# Patient Record
Sex: Male | Born: 1955 | Race: Black or African American | Hispanic: No | Smoking: Former smoker
Health system: Southern US, Community
[De-identification: ages and names within clinical notes are randomized; demographics above are authoritative.]

## PROBLEM LIST (undated history)

## (undated) DIAGNOSIS — R001 Bradycardia, unspecified: Secondary | ICD-10-CM

## (undated) DIAGNOSIS — R7989 Other specified abnormal findings of blood chemistry: Secondary | ICD-10-CM

## (undated) DIAGNOSIS — I10 Essential (primary) hypertension: Secondary | ICD-10-CM

## (undated) DIAGNOSIS — M1990 Unspecified osteoarthritis, unspecified site: Secondary | ICD-10-CM

## (undated) HISTORY — PX: HERNIA REPAIR: SHX51

## (undated) HISTORY — DX: Bradycardia, unspecified: R00.1

## (undated) HISTORY — DX: Unspecified osteoarthritis, unspecified site: M19.90

## (undated) HISTORY — DX: Other specified abnormal findings of blood chemistry: R79.89

## (undated) HISTORY — PX: COLON SURGERY: SHX602

## (undated) HISTORY — PX: KNEE SURGERY: SHX244

---

## 2000-11-28 ENCOUNTER — Encounter: Admission: RE | Admit: 2000-11-28 | Discharge: 2000-11-28 | Payer: Self-pay | Admitting: Family Medicine

## 2000-11-28 ENCOUNTER — Encounter: Payer: Self-pay | Admitting: Family Medicine

## 2000-12-26 ENCOUNTER — Ambulatory Visit (HOSPITAL_COMMUNITY): Admission: RE | Admit: 2000-12-26 | Discharge: 2000-12-26 | Payer: Self-pay | Admitting: *Deleted

## 2004-09-08 ENCOUNTER — Encounter: Admission: RE | Admit: 2004-09-08 | Discharge: 2004-09-08 | Payer: Self-pay | Admitting: Family Medicine

## 2006-01-05 ENCOUNTER — Emergency Department (HOSPITAL_COMMUNITY): Admission: EM | Admit: 2006-01-05 | Discharge: 2006-01-05 | Payer: Self-pay | Admitting: Emergency Medicine

## 2006-10-16 ENCOUNTER — Ambulatory Visit (HOSPITAL_COMMUNITY): Admission: RE | Admit: 2006-10-16 | Discharge: 2006-10-16 | Payer: Self-pay | Admitting: Family Medicine

## 2006-11-01 ENCOUNTER — Encounter: Admission: RE | Admit: 2006-11-01 | Discharge: 2006-11-01 | Payer: Self-pay | Admitting: Family Medicine

## 2007-02-25 ENCOUNTER — Encounter: Admission: RE | Admit: 2007-02-25 | Discharge: 2007-02-25 | Payer: Self-pay | Admitting: Family Medicine

## 2007-04-25 ENCOUNTER — Encounter: Admission: RE | Admit: 2007-04-25 | Discharge: 2007-04-25 | Payer: Self-pay | Admitting: Family Medicine

## 2007-05-02 ENCOUNTER — Encounter: Admission: RE | Admit: 2007-05-02 | Discharge: 2007-05-02 | Payer: Self-pay | Admitting: Orthopedic Surgery

## 2007-05-23 ENCOUNTER — Ambulatory Visit (HOSPITAL_COMMUNITY): Admission: RE | Admit: 2007-05-23 | Discharge: 2007-05-23 | Payer: Self-pay | Admitting: Orthopedic Surgery

## 2007-06-18 ENCOUNTER — Encounter: Admission: RE | Admit: 2007-06-18 | Discharge: 2007-07-17 | Payer: Self-pay | Admitting: Orthopedic Surgery

## 2007-09-16 ENCOUNTER — Encounter: Admission: RE | Admit: 2007-09-16 | Discharge: 2007-09-16 | Payer: Self-pay | Admitting: Orthopedic Surgery

## 2007-10-28 ENCOUNTER — Encounter: Admission: RE | Admit: 2007-10-28 | Discharge: 2007-10-28 | Payer: Self-pay | Admitting: Orthopedic Surgery

## 2007-11-07 ENCOUNTER — Encounter: Admission: RE | Admit: 2007-11-07 | Discharge: 2007-12-05 | Payer: Self-pay | Admitting: Orthopedic Surgery

## 2009-05-17 ENCOUNTER — Inpatient Hospital Stay (HOSPITAL_COMMUNITY): Admission: EM | Admit: 2009-05-17 | Discharge: 2009-05-19 | Payer: Self-pay | Admitting: Emergency Medicine

## 2009-05-22 ENCOUNTER — Emergency Department (HOSPITAL_COMMUNITY): Admission: EM | Admit: 2009-05-22 | Discharge: 2009-05-22 | Payer: Self-pay | Admitting: Emergency Medicine

## 2009-05-23 ENCOUNTER — Emergency Department (HOSPITAL_COMMUNITY): Admission: EM | Admit: 2009-05-23 | Discharge: 2009-05-23 | Payer: Self-pay | Admitting: Emergency Medicine

## 2009-06-15 ENCOUNTER — Encounter: Admission: RE | Admit: 2009-06-15 | Discharge: 2009-09-13 | Payer: Self-pay | Admitting: Orthopedic Surgery

## 2009-11-18 ENCOUNTER — Encounter
Admission: RE | Admit: 2009-11-18 | Discharge: 2010-01-12 | Payer: Self-pay | Source: Home / Self Care | Attending: Orthopedic Surgery | Admitting: Orthopedic Surgery

## 2010-04-12 LAB — COMPREHENSIVE METABOLIC PANEL
ALT: 20 U/L (ref 0–53)
AST: 37 U/L (ref 0–37)
CO2: 15 mEq/L — ABNORMAL LOW (ref 19–32)
Chloride: 103 mEq/L (ref 96–112)
GFR calc Af Amer: 49 mL/min — ABNORMAL LOW (ref >60)
GFR calc non Af Amer: 41 mL/min — ABNORMAL LOW (ref >60)
Glucose, Bld: 199 mg/dL — ABNORMAL HIGH (ref 70–99)
Potassium: 4.3 mEq/L (ref 3.5–5.1)
Total Bilirubin: 1 mg/dL (ref 0.3–1.2)

## 2010-04-12 LAB — HEPATIC FUNCTION PANEL
AST: 38 U/L — ABNORMAL HIGH (ref 0–37)
Albumin: 3.5 g/dL (ref 3.5–5.2)
Alkaline Phosphatase: 51 U/L (ref 39–117)
Total Bilirubin: 1.1 mg/dL (ref 0.3–1.2)

## 2010-04-12 LAB — URINE CULTURE

## 2010-04-12 LAB — CBC
HCT: 34.2 % — ABNORMAL LOW (ref 39.0–52.0)
Hemoglobin: 12.1 g/dL — ABNORMAL LOW (ref 13.0–17.0)
Hemoglobin: 13.1 g/dL (ref 13.0–17.0)
MCHC: 35.5 g/dL (ref 30.0–36.0)
MCV: 90.7 fL (ref 78.0–100.0)
MCV: 91.9 fL (ref 78.0–100.0)
Platelets: 211 10*3/uL (ref 150–400)
RDW: 11.8 % (ref 11.5–15.5)
RDW: 12 % (ref 11.5–15.5)
RDW: 12.1 % (ref 11.5–15.5)

## 2010-04-12 LAB — POCT I-STAT, CHEM 8
Creatinine, Ser: 1.7 mg/dL — ABNORMAL HIGH (ref 0.4–1.5)
Glucose, Bld: 198 mg/dL — ABNORMAL HIGH (ref 70–99)
Hemoglobin: 13.6 g/dL (ref 13.0–17.0)
Potassium: 4.1 mEq/L (ref 3.5–5.1)

## 2010-04-12 LAB — BASIC METABOLIC PANEL
BUN: 15 mg/dL (ref 6–23)
CO2: 23 mEq/L (ref 19–32)
Chloride: 107 mEq/L (ref 96–112)
Creatinine, Ser: 1.19 mg/dL (ref 0.4–1.5)

## 2010-04-12 LAB — URINALYSIS, ROUTINE W REFLEX MICROSCOPIC
Bilirubin Urine: NEGATIVE
Protein, ur: 30 mg/dL — AB

## 2010-04-12 LAB — ETHANOL: Alcohol, Ethyl (B): <5 mg/dL (ref 0–10)

## 2010-04-12 LAB — LACTIC ACID, PLASMA: Lactic Acid, Venous: 0.7 mmol/L (ref 0.5–2.2)

## 2010-04-12 LAB — RAPID URINE DRUG SCREEN, HOSP PERFORMED
Barbiturates: NOT DETECTED
Benzodiazepines: NOT DETECTED

## 2010-04-12 LAB — URINE MICROSCOPIC-ADD ON

## 2010-04-12 LAB — LIPASE, BLOOD: Lipase: 27 U/L (ref 11–59)

## 2010-04-12 LAB — PROTIME-INR: Prothrombin Time: 13.4 seconds (ref 11.6–15.2)

## 2010-06-07 NOTE — Op Note (Signed)
NAMEKENDAL, Randy Melton             ACCOUNT NO.:  [id number]   MEDICAL RECORD NO.:  [id number]          PATIENT TYPE:  AMB   LOCATION:  SDS                          FACILITY:  [facility]   PHYSICIAN:  Myrtie Neither, MD      DATE OF BIRTH:  [birth year]   DATE OF PROCEDURE:  05/23/2007  DATE OF DISCHARGE:                               OPERATIVE REPORT   PREOPERATIVE DIAGNOSIS:  Internal derangement of left knee.   POSTOPERATIVE DIAGNOSES:  1. Medial meniscal tear.  2. Condyle defect, intercondylar notch.  3. Patellar plaque with chronic synovitis, medial lateral compartment.  4. Partial lateral meniscal tear.   ANESTHESIA:  General.   PROCEDURE:  1. Arthroscopic partial medial meniscectomy.  2. Synovectomy with excision of plaque, complete.  3. Partial lateral meniscectomy.  4. Chondroplasty, intercondylar notch.   The patient was taken to the operating room and was given adequate preop  medications, given general anesthesia and was intubated.  Left knee was  prepped with DuraPrep and draped in sterile manner.  Tourniquet used for  hemostasis.  A 1/2-inch puncture wound made along the edge of the medial  and lateral joint line.  Inflow portal through the median  suprapatellar  pouch area.  Inspection of the joint revealed posterior horn tear of the  medial meniscus and partial mid section meniscal tear of the lateral  meniscus.  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  Loose bodies.  ACL was intact.  With large  patellar plaque with chronic synovitis in both medial and lateral  compartments.  With a synovial shaver, the patellar plaque and synovium  were resected.  Followed by, use of basket forceps for partial resection  of the medial meniscus as well as the bed of the of the lateral  meniscus.  Chondroplasty was done at the intercondylar notch with a  synovial  shaver.  This was further roughened up around the entire  surface.  Loose fragments were removed.  Further inspection did not  reveal any other loose fragments.  Wound closure was then done with 4-0  nylon.  A 15 mL of 0.25% plain Marcaine was injected into the knee.  Compressive dressing was applied.  The patient tolerated the procedure  quite well and taken to recovery room in stable and satisfactory  condition.  The patient is being discharged home.  Percocet 1-2 q.4  p.r.n. pain, ice packs, partial weightbearing on the left side with the  use of crutches.  The patient is to return to the office in 1 week.  The  patient is being discharged in stable and satisfactory condition.     Myrtie Neither, MD  Electronically Signed    AC/MEDQ  D:  05/23/2007  T:  05/23/2007  Job:  [id number]

## 2010-06-27 ENCOUNTER — Encounter (INDEPENDENT_AMBULATORY_CARE_PROVIDER_SITE_OTHER): Payer: Self-pay | Admitting: General Surgery

## 2010-09-19 ENCOUNTER — Ambulatory Visit (INDEPENDENT_AMBULATORY_CARE_PROVIDER_SITE_OTHER): Payer: Self-pay | Admitting: General Surgery

## 2010-10-18 LAB — URINALYSIS, ROUTINE W REFLEX MICROSCOPIC
Glucose, UA: NEGATIVE
Hgb urine dipstick: NEGATIVE
Protein, ur: NEGATIVE

## 2010-10-18 LAB — CBC
Platelets: 277
RDW: 12

## 2010-10-18 LAB — COMPREHENSIVE METABOLIC PANEL
ALT: 21
Albumin: 3.8
Alkaline Phosphatase: 69
Potassium: 3.9
Sodium: 138
Total Protein: 6.9

## 2010-12-12 ENCOUNTER — Encounter (INDEPENDENT_AMBULATORY_CARE_PROVIDER_SITE_OTHER): Payer: Self-pay | Admitting: General Surgery

## 2012-01-25 DIAGNOSIS — I10 Essential (primary) hypertension: Secondary | ICD-10-CM | POA: Diagnosis not present

## 2012-01-25 DIAGNOSIS — Z125 Encounter for screening for malignant neoplasm of prostate: Secondary | ICD-10-CM | POA: Diagnosis not present

## 2012-01-25 DIAGNOSIS — E782 Mixed hyperlipidemia: Secondary | ICD-10-CM | POA: Diagnosis not present

## 2012-01-29 DIAGNOSIS — R0602 Shortness of breath: Secondary | ICD-10-CM | POA: Diagnosis not present

## 2012-02-08 DIAGNOSIS — R9431 Abnormal electrocardiogram [ECG] [EKG]: Secondary | ICD-10-CM | POA: Diagnosis not present

## 2012-02-15 DIAGNOSIS — I10 Essential (primary) hypertension: Secondary | ICD-10-CM | POA: Diagnosis not present

## 2012-03-13 DIAGNOSIS — Z86010 Personal history of colonic polyps: Secondary | ICD-10-CM | POA: Diagnosis not present

## 2012-03-22 ENCOUNTER — Ambulatory Visit
Admission: RE | Admit: 2012-03-22 | Discharge: 2012-03-22 | Disposition: A | Discharge status: Home / Self Care | Payer: Medicare Other | Source: Ambulatory Visit | Attending: Family Medicine | Admitting: Family Medicine

## 2012-03-22 ENCOUNTER — Other Ambulatory Visit: Payer: Self-pay | Admitting: Family Medicine

## 2012-03-22 DIAGNOSIS — M25559 Pain in unspecified hip: Secondary | ICD-10-CM | POA: Diagnosis not present

## 2012-03-22 DIAGNOSIS — M1250 Traumatic arthropathy, unspecified site: Secondary | ICD-10-CM

## 2012-03-22 DIAGNOSIS — M169 Osteoarthritis of hip, unspecified: Secondary | ICD-10-CM | POA: Diagnosis not present

## 2012-03-22 DIAGNOSIS — R52 Pain, unspecified: Secondary | ICD-10-CM

## 2012-03-22 DIAGNOSIS — I10 Essential (primary) hypertension: Secondary | ICD-10-CM | POA: Diagnosis not present

## 2012-03-26 DIAGNOSIS — M87059 Idiopathic aseptic necrosis of unspecified femur: Secondary | ICD-10-CM | POA: Diagnosis not present

## 2012-04-09 DIAGNOSIS — Z1211 Encounter for screening for malignant neoplasm of colon: Secondary | ICD-10-CM | POA: Diagnosis not present

## 2012-04-09 DIAGNOSIS — K649 Unspecified hemorrhoids: Secondary | ICD-10-CM | POA: Diagnosis not present

## 2012-04-09 DIAGNOSIS — K5730 Diverticulosis of large intestine without perforation or abscess without bleeding: Secondary | ICD-10-CM | POA: Diagnosis not present

## 2012-04-09 DIAGNOSIS — Z86010 Personal history of colonic polyps: Secondary | ICD-10-CM | POA: Diagnosis not present

## 2012-04-15 DIAGNOSIS — M87059 Idiopathic aseptic necrosis of unspecified femur: Secondary | ICD-10-CM | POA: Diagnosis not present

## 2012-06-14 DIAGNOSIS — H2510 Age-related nuclear cataract, unspecified eye: Secondary | ICD-10-CM | POA: Diagnosis not present

## 2012-06-18 DIAGNOSIS — M87059 Idiopathic aseptic necrosis of unspecified femur: Secondary | ICD-10-CM | POA: Diagnosis not present

## 2012-06-20 DIAGNOSIS — M109 Gout, unspecified: Secondary | ICD-10-CM | POA: Diagnosis not present

## 2012-06-20 DIAGNOSIS — I10 Essential (primary) hypertension: Secondary | ICD-10-CM | POA: Diagnosis not present

## 2012-07-09 DIAGNOSIS — M2040 Other hammer toe(s) (acquired), unspecified foot: Secondary | ICD-10-CM | POA: Diagnosis not present

## 2012-07-09 DIAGNOSIS — Z01818 Encounter for other preprocedural examination: Secondary | ICD-10-CM | POA: Diagnosis not present

## 2012-07-09 DIAGNOSIS — M79609 Pain in unspecified limb: Secondary | ICD-10-CM | POA: Diagnosis not present

## 2012-07-09 DIAGNOSIS — B351 Tinea unguium: Secondary | ICD-10-CM | POA: Diagnosis not present

## 2012-07-15 DIAGNOSIS — M25579 Pain in unspecified ankle and joints of unspecified foot: Secondary | ICD-10-CM | POA: Diagnosis not present

## 2012-08-07 DIAGNOSIS — M21619 Bunion of unspecified foot: Secondary | ICD-10-CM | POA: Diagnosis not present

## 2012-08-07 DIAGNOSIS — Z01818 Encounter for other preprocedural examination: Secondary | ICD-10-CM | POA: Diagnosis not present

## 2012-08-20 DIAGNOSIS — M7750 Other enthesopathy of unspecified foot: Secondary | ICD-10-CM | POA: Diagnosis not present

## 2012-08-20 DIAGNOSIS — M25579 Pain in unspecified ankle and joints of unspecified foot: Secondary | ICD-10-CM | POA: Diagnosis not present

## 2012-08-20 DIAGNOSIS — M2040 Other hammer toe(s) (acquired), unspecified foot: Secondary | ICD-10-CM | POA: Diagnosis not present

## 2012-08-20 DIAGNOSIS — I10 Essential (primary) hypertension: Secondary | ICD-10-CM | POA: Diagnosis not present

## 2012-08-23 DIAGNOSIS — M25579 Pain in unspecified ankle and joints of unspecified foot: Secondary | ICD-10-CM | POA: Diagnosis not present

## 2012-09-04 DIAGNOSIS — M25579 Pain in unspecified ankle and joints of unspecified foot: Secondary | ICD-10-CM | POA: Diagnosis not present

## 2012-09-18 DIAGNOSIS — M25579 Pain in unspecified ankle and joints of unspecified foot: Secondary | ICD-10-CM | POA: Diagnosis not present

## 2012-09-18 DIAGNOSIS — B351 Tinea unguium: Secondary | ICD-10-CM | POA: Diagnosis not present

## 2012-10-16 DIAGNOSIS — B351 Tinea unguium: Secondary | ICD-10-CM | POA: Diagnosis not present

## 2012-10-16 DIAGNOSIS — M79609 Pain in unspecified limb: Secondary | ICD-10-CM | POA: Diagnosis not present

## 2012-10-16 DIAGNOSIS — M25579 Pain in unspecified ankle and joints of unspecified foot: Secondary | ICD-10-CM | POA: Diagnosis not present

## 2012-10-22 DIAGNOSIS — E7800 Pure hypercholesterolemia, unspecified: Secondary | ICD-10-CM | POA: Diagnosis not present

## 2012-10-22 DIAGNOSIS — I10 Essential (primary) hypertension: Secondary | ICD-10-CM | POA: Diagnosis not present

## 2013-01-13 DIAGNOSIS — M79609 Pain in unspecified limb: Secondary | ICD-10-CM | POA: Diagnosis not present

## 2013-02-19 DIAGNOSIS — I10 Essential (primary) hypertension: Secondary | ICD-10-CM | POA: Diagnosis not present

## 2013-02-19 DIAGNOSIS — E782 Mixed hyperlipidemia: Secondary | ICD-10-CM | POA: Diagnosis not present

## 2013-02-19 DIAGNOSIS — Z125 Encounter for screening for malignant neoplasm of prostate: Secondary | ICD-10-CM | POA: Diagnosis not present

## 2013-03-10 DIAGNOSIS — M87059 Idiopathic aseptic necrosis of unspecified femur: Secondary | ICD-10-CM | POA: Diagnosis not present

## 2013-06-10 DIAGNOSIS — M7120 Synovial cyst of popliteal space [Baker], unspecified knee: Secondary | ICD-10-CM | POA: Diagnosis not present

## 2013-07-22 DIAGNOSIS — I10 Essential (primary) hypertension: Secondary | ICD-10-CM | POA: Diagnosis not present

## 2013-08-14 DIAGNOSIS — H2510 Age-related nuclear cataract, unspecified eye: Secondary | ICD-10-CM | POA: Diagnosis not present

## 2013-11-04 DIAGNOSIS — M5136 Other intervertebral disc degeneration, lumbar region: Secondary | ICD-10-CM | POA: Diagnosis not present

## 2013-11-05 ENCOUNTER — Ambulatory Visit
Admission: RE | Admit: 2013-11-05 | Discharge: 2013-11-05 | Disposition: A | Discharge status: Home / Self Care | Payer: Medicare Other | Source: Ambulatory Visit | Attending: Orthopedic Surgery | Admitting: Orthopedic Surgery

## 2013-11-05 ENCOUNTER — Other Ambulatory Visit: Payer: Self-pay | Admitting: Orthopedic Surgery

## 2013-11-05 DIAGNOSIS — M4316 Spondylolisthesis, lumbar region: Secondary | ICD-10-CM | POA: Diagnosis not present

## 2013-11-05 DIAGNOSIS — M5136 Other intervertebral disc degeneration, lumbar region: Secondary | ICD-10-CM

## 2013-11-05 DIAGNOSIS — M47817 Spondylosis without myelopathy or radiculopathy, lumbosacral region: Secondary | ICD-10-CM | POA: Diagnosis not present

## 2013-11-05 DIAGNOSIS — M5137 Other intervertebral disc degeneration, lumbosacral region: Secondary | ICD-10-CM | POA: Diagnosis not present

## 2013-11-13 ENCOUNTER — Ambulatory Visit
Admission: RE | Admit: 2013-11-13 | Discharge: 2013-11-13 | Disposition: A | Discharge status: Home / Self Care | Payer: Medicare Other | Source: Ambulatory Visit | Attending: Family Medicine | Admitting: Family Medicine

## 2013-11-13 ENCOUNTER — Other Ambulatory Visit: Payer: Self-pay | Admitting: Family Medicine

## 2013-11-13 DIAGNOSIS — M47816 Spondylosis without myelopathy or radiculopathy, lumbar region: Secondary | ICD-10-CM | POA: Diagnosis not present

## 2013-11-13 DIAGNOSIS — M545 Low back pain, unspecified: Secondary | ICD-10-CM

## 2013-11-13 DIAGNOSIS — M5137 Other intervertebral disc degeneration, lumbosacral region: Secondary | ICD-10-CM | POA: Diagnosis not present

## 2013-11-13 DIAGNOSIS — M4316 Spondylolisthesis, lumbar region: Secondary | ICD-10-CM | POA: Diagnosis not present

## 2013-11-18 DIAGNOSIS — M545 Low back pain: Secondary | ICD-10-CM | POA: Diagnosis not present

## 2013-12-01 DIAGNOSIS — M62838 Other muscle spasm: Secondary | ICD-10-CM | POA: Diagnosis not present

## 2013-12-01 DIAGNOSIS — M9902 Segmental and somatic dysfunction of thoracic region: Secondary | ICD-10-CM | POA: Diagnosis not present

## 2013-12-01 DIAGNOSIS — M9903 Segmental and somatic dysfunction of lumbar region: Secondary | ICD-10-CM | POA: Diagnosis not present

## 2013-12-03 DIAGNOSIS — M4316 Spondylolisthesis, lumbar region: Secondary | ICD-10-CM | POA: Diagnosis not present

## 2013-12-04 DIAGNOSIS — M62838 Other muscle spasm: Secondary | ICD-10-CM | POA: Diagnosis not present

## 2013-12-04 DIAGNOSIS — M9902 Segmental and somatic dysfunction of thoracic region: Secondary | ICD-10-CM | POA: Diagnosis not present

## 2013-12-04 DIAGNOSIS — M9903 Segmental and somatic dysfunction of lumbar region: Secondary | ICD-10-CM | POA: Diagnosis not present

## 2013-12-09 DIAGNOSIS — M9903 Segmental and somatic dysfunction of lumbar region: Secondary | ICD-10-CM | POA: Diagnosis not present

## 2013-12-09 DIAGNOSIS — M9902 Segmental and somatic dysfunction of thoracic region: Secondary | ICD-10-CM | POA: Diagnosis not present

## 2013-12-09 DIAGNOSIS — M62838 Other muscle spasm: Secondary | ICD-10-CM | POA: Diagnosis not present

## 2013-12-11 DIAGNOSIS — M9903 Segmental and somatic dysfunction of lumbar region: Secondary | ICD-10-CM | POA: Diagnosis not present

## 2013-12-11 DIAGNOSIS — M62838 Other muscle spasm: Secondary | ICD-10-CM | POA: Diagnosis not present

## 2013-12-11 DIAGNOSIS — M9902 Segmental and somatic dysfunction of thoracic region: Secondary | ICD-10-CM | POA: Diagnosis not present

## 2013-12-16 DIAGNOSIS — M9903 Segmental and somatic dysfunction of lumbar region: Secondary | ICD-10-CM | POA: Diagnosis not present

## 2013-12-16 DIAGNOSIS — M62838 Other muscle spasm: Secondary | ICD-10-CM | POA: Diagnosis not present

## 2013-12-16 DIAGNOSIS — M9902 Segmental and somatic dysfunction of thoracic region: Secondary | ICD-10-CM | POA: Diagnosis not present

## 2013-12-24 DIAGNOSIS — M545 Low back pain: Secondary | ICD-10-CM | POA: Diagnosis not present

## 2013-12-24 DIAGNOSIS — I10 Essential (primary) hypertension: Secondary | ICD-10-CM | POA: Diagnosis not present

## 2013-12-25 DIAGNOSIS — M62838 Other muscle spasm: Secondary | ICD-10-CM | POA: Diagnosis not present

## 2013-12-25 DIAGNOSIS — M9903 Segmental and somatic dysfunction of lumbar region: Secondary | ICD-10-CM | POA: Diagnosis not present

## 2013-12-25 DIAGNOSIS — M9902 Segmental and somatic dysfunction of thoracic region: Secondary | ICD-10-CM | POA: Diagnosis not present

## 2013-12-30 DIAGNOSIS — M9902 Segmental and somatic dysfunction of thoracic region: Secondary | ICD-10-CM | POA: Diagnosis not present

## 2013-12-30 DIAGNOSIS — M62838 Other muscle spasm: Secondary | ICD-10-CM | POA: Diagnosis not present

## 2013-12-30 DIAGNOSIS — M9903 Segmental and somatic dysfunction of lumbar region: Secondary | ICD-10-CM | POA: Diagnosis not present

## 2014-01-06 DIAGNOSIS — M62838 Other muscle spasm: Secondary | ICD-10-CM | POA: Diagnosis not present

## 2014-01-06 DIAGNOSIS — M9903 Segmental and somatic dysfunction of lumbar region: Secondary | ICD-10-CM | POA: Diagnosis not present

## 2014-01-06 DIAGNOSIS — M9902 Segmental and somatic dysfunction of thoracic region: Secondary | ICD-10-CM | POA: Diagnosis not present

## 2014-01-08 DIAGNOSIS — M9903 Segmental and somatic dysfunction of lumbar region: Secondary | ICD-10-CM | POA: Diagnosis not present

## 2014-01-08 DIAGNOSIS — M62838 Other muscle spasm: Secondary | ICD-10-CM | POA: Diagnosis not present

## 2014-01-08 DIAGNOSIS — M9902 Segmental and somatic dysfunction of thoracic region: Secondary | ICD-10-CM | POA: Diagnosis not present

## 2014-01-20 DIAGNOSIS — M9903 Segmental and somatic dysfunction of lumbar region: Secondary | ICD-10-CM | POA: Diagnosis not present

## 2014-01-20 DIAGNOSIS — M62838 Other muscle spasm: Secondary | ICD-10-CM | POA: Diagnosis not present

## 2014-01-20 DIAGNOSIS — M9902 Segmental and somatic dysfunction of thoracic region: Secondary | ICD-10-CM | POA: Diagnosis not present

## 2014-03-24 DIAGNOSIS — G5601 Carpal tunnel syndrome, right upper limb: Secondary | ICD-10-CM | POA: Diagnosis not present

## 2014-03-24 DIAGNOSIS — I10 Essential (primary) hypertension: Secondary | ICD-10-CM | POA: Diagnosis not present

## 2014-03-24 DIAGNOSIS — M545 Low back pain: Secondary | ICD-10-CM | POA: Diagnosis not present

## 2014-04-01 DIAGNOSIS — G5621 Lesion of ulnar nerve, right upper limb: Secondary | ICD-10-CM | POA: Diagnosis not present

## 2014-04-01 DIAGNOSIS — M19041 Primary osteoarthritis, right hand: Secondary | ICD-10-CM | POA: Diagnosis not present

## 2014-04-01 DIAGNOSIS — G5622 Lesion of ulnar nerve, left upper limb: Secondary | ICD-10-CM | POA: Diagnosis not present

## 2014-04-08 DIAGNOSIS — G5602 Carpal tunnel syndrome, left upper limb: Secondary | ICD-10-CM | POA: Diagnosis not present

## 2014-04-08 DIAGNOSIS — G5601 Carpal tunnel syndrome, right upper limb: Secondary | ICD-10-CM | POA: Diagnosis not present

## 2014-04-08 DIAGNOSIS — G5621 Lesion of ulnar nerve, right upper limb: Secondary | ICD-10-CM | POA: Diagnosis not present

## 2014-04-08 DIAGNOSIS — M19041 Primary osteoarthritis, right hand: Secondary | ICD-10-CM | POA: Diagnosis not present

## 2014-04-29 DIAGNOSIS — Z0000 Encounter for general adult medical examination without abnormal findings: Secondary | ICD-10-CM | POA: Diagnosis not present

## 2014-06-18 DIAGNOSIS — Z0000 Encounter for general adult medical examination without abnormal findings: Secondary | ICD-10-CM | POA: Diagnosis not present

## 2014-06-18 DIAGNOSIS — I10 Essential (primary) hypertension: Secondary | ICD-10-CM | POA: Diagnosis not present

## 2014-09-18 DIAGNOSIS — M7541 Impingement syndrome of right shoulder: Secondary | ICD-10-CM | POA: Diagnosis not present

## 2014-09-24 DIAGNOSIS — M19011 Primary osteoarthritis, right shoulder: Secondary | ICD-10-CM | POA: Diagnosis not present

## 2014-10-07 DIAGNOSIS — M25511 Pain in right shoulder: Secondary | ICD-10-CM | POA: Diagnosis not present

## 2014-10-07 DIAGNOSIS — M7541 Impingement syndrome of right shoulder: Secondary | ICD-10-CM | POA: Diagnosis not present

## 2014-10-20 DIAGNOSIS — I10 Essential (primary) hypertension: Secondary | ICD-10-CM | POA: Diagnosis not present

## 2014-10-20 DIAGNOSIS — G5601 Carpal tunnel syndrome, right upper limb: Secondary | ICD-10-CM | POA: Diagnosis not present

## 2014-11-04 DIAGNOSIS — G5621 Lesion of ulnar nerve, right upper limb: Secondary | ICD-10-CM | POA: Diagnosis not present

## 2014-11-17 DIAGNOSIS — M19011 Primary osteoarthritis, right shoulder: Secondary | ICD-10-CM | POA: Diagnosis not present

## 2014-11-17 DIAGNOSIS — M25411 Effusion, right shoulder: Secondary | ICD-10-CM | POA: Diagnosis not present

## 2014-11-17 DIAGNOSIS — M65811 Other synovitis and tenosynovitis, right shoulder: Secondary | ICD-10-CM | POA: Diagnosis not present

## 2014-11-17 DIAGNOSIS — M75121 Complete rotator cuff tear or rupture of right shoulder, not specified as traumatic: Secondary | ICD-10-CM | POA: Diagnosis not present

## 2014-12-14 DIAGNOSIS — R200 Anesthesia of skin: Secondary | ICD-10-CM | POA: Diagnosis not present

## 2014-12-22 ENCOUNTER — Encounter: Payer: Medicare Other | Admitting: Neurology

## 2014-12-28 DIAGNOSIS — M7541 Impingement syndrome of right shoulder: Secondary | ICD-10-CM | POA: Diagnosis not present

## 2015-01-06 DIAGNOSIS — G5601 Carpal tunnel syndrome, right upper limb: Secondary | ICD-10-CM | POA: Diagnosis not present

## 2015-01-06 DIAGNOSIS — G5602 Carpal tunnel syndrome, left upper limb: Secondary | ICD-10-CM | POA: Diagnosis not present

## 2015-01-13 DIAGNOSIS — Z6827 Body mass index (BMI) 27.0-27.9, adult: Secondary | ICD-10-CM | POA: Diagnosis not present

## 2015-01-13 DIAGNOSIS — G5602 Carpal tunnel syndrome, left upper limb: Secondary | ICD-10-CM | POA: Diagnosis not present

## 2015-01-13 DIAGNOSIS — G5600 Carpal tunnel syndrome, unspecified upper limb: Secondary | ICD-10-CM | POA: Diagnosis not present

## 2015-01-13 DIAGNOSIS — I10 Essential (primary) hypertension: Secondary | ICD-10-CM | POA: Diagnosis not present

## 2015-01-15 ENCOUNTER — Emergency Department (INDEPENDENT_AMBULATORY_CARE_PROVIDER_SITE_OTHER)
Admission: EM | Admit: 2015-01-15 | Discharge: 2015-01-15 | Disposition: A | Discharge status: Home / Self Care | Payer: Medicare Other | Source: Home / Self Care

## 2015-01-15 ENCOUNTER — Encounter (HOSPITAL_COMMUNITY): Payer: Self-pay | Admitting: Emergency Medicine

## 2015-01-15 DIAGNOSIS — L03011 Cellulitis of right finger: Secondary | ICD-10-CM

## 2015-01-15 MED ORDER — CEPHALEXIN 250 MG PO CAPS: 250.0000 mg | ORAL_CAPSULE | Freq: Three times a day (TID) | ORAL | Status: DC

## 2015-01-15 NOTE — ED Notes (Signed)
See physicians note C/o right thumb pain and swelling A&O x4... No acute distress.

## 2015-01-15 NOTE — Discharge Instructions (Signed)
Paronychia  °Paronychia is an infection of the skin. It happens near a fingernail or toenail. It may cause pain and swelling around the nail. Usually, it is not serious and it clears up with treatment. °HOME CARE °· Soak the fingers or toes in warm water as told by your doctor. You may be told to do this for 20 minutes, 2-3 times a day. °· Keep the area dry when you are not soaking it. °· Take medicines only as told by your doctor. °· If you were given an antibiotic medicine, finish all of it even if you start to feel better. °· Keep the affected area clean. °· Do not try to drain a fluid-filled bump yourself. °· Wear rubber gloves when putting your hands in water. °· Wear gloves if your hands might touch cleaners or chemicals. °· Follow your doctor's instructions about: °¨ Wound care. °¨ Bandage (dressing) changes and removal. °GET HELP IF: °· Your symptoms get worse or do not improve. °· You have a fever or chills. °· You have redness spreading from the affected area. °· You have more fluid, blood, or pus coming from the affected area. °· Your finger or knuckle is swollen or is hard to move. °  °This information is not intended to replace advice given to you by your health care provider. Make sure you discuss any questions you have with your health care provider. °  °Document Released: 12/28/2008 Document Revised: 05/26/2014 Document Reviewed: 12/17/2013 °Elsevier Interactive Patient Education ©2016 Elsevier Inc. ° °

## 2015-01-15 NOTE — ED Provider Notes (Signed)
CSN: [id number]     Arrival date & time 01/15/15  1950 History   None    No chief complaint on file.  (Consider location/radiation/quality/duration/timing/severity/associated sxs/prior Treatment) HPI History obtained from patient:   LOCATION:right thumb SEVERITY:3 DURATION:2 days CONTEXT:chopping garlic QUALITY: MODIFYING FACTORS: none ASSOCIATED SYMPTOMS: swelling, pain TIMING:constant OCCUPATION:retired  Past Medical History  Diagnosis Date  . Arthritis    Past Surgical History  Procedure Laterality Date  . Colon surgery    . Knee surgery    . Hernia repair     No family history on file. Social History  Substance Use Topics  . Smoking status: Unknown If Ever Smoked  . Smokeless tobacco: Not on file  . Alcohol Use: Yes     Comment: 2 CUPS OF WINE 3 DAYS A WEEK    Review of Systems ROS +'ve swollen finger  Denies: HEADACHE, NAUSEA, ABDOMINAL PAIN, CHEST PAIN, CONGESTION, DYSURIA, SHORTNESS OF BREATH  Allergies  Review of patient's allergies indicates not on file.  Home Medications   Prior to Admission medications   Medication Sig Start Date End Date Taking? Authorizing Provider  cephALEXin (KEFLEX) 250 MG capsule Take 1 capsule (250 mg total) by mouth 3 (three) times daily. 01/15/15   Tharon AquasFrank C Patrick, PA  fish oil-omega-3 fatty acids 1000 MG capsule Take 2 g by mouth daily.      Historical Provider, MD  Multiple Vitamin (MULTIVITAMIN) tablet Take 1 tablet by mouth daily.      Historical Provider, MD  Valsartan (DIOVAN PO) Take by mouth.      Historical Provider, MD   Meds Ordered and Administered this Visit  Medications - No data to display  BP 153/97 mmHg  Pulse 77  Temp(Src) 98.1 F (36.7 C) (Oral)  SpO2 98% No data found.   Physical Exam  Musculoskeletal: He exhibits tenderness.       Hands:   ED Course  Procedures (including critical care time)  Labs Review Labs Reviewed - No data to display  Imaging Review No results  found.   Visual Acuity Review  Right Eye Distance:   Left Eye Distance:   Bilateral Distance:    Right Eye Near:   Left Eye Near:    Bilateral Near:         MDM   1. Paronychia, right    Hot soaks Keflex If not better next week, may need to wedge nail to explore for foreign body.    Tharon AquasFrank C Patrick, [place]PA 01/15/15 2042

## 2015-01-16 ENCOUNTER — Emergency Department (HOSPITAL_COMMUNITY)
Admission: EM | Admit: 2015-01-16 | Discharge: 2015-01-16 | Disposition: A | Discharge status: Home / Self Care | Payer: Medicare Other | Attending: Emergency Medicine | Admitting: Emergency Medicine

## 2015-01-16 ENCOUNTER — Encounter (HOSPITAL_COMMUNITY): Payer: Self-pay | Admitting: Emergency Medicine

## 2015-01-16 ENCOUNTER — Emergency Department (HOSPITAL_COMMUNITY): Payer: Medicare Other

## 2015-01-16 DIAGNOSIS — M7989 Other specified soft tissue disorders: Secondary | ICD-10-CM | POA: Diagnosis not present

## 2015-01-16 DIAGNOSIS — Z23 Encounter for immunization: Secondary | ICD-10-CM | POA: Insufficient documentation

## 2015-01-16 DIAGNOSIS — I10 Essential (primary) hypertension: Secondary | ICD-10-CM | POA: Diagnosis not present

## 2015-01-16 DIAGNOSIS — Z8739 Personal history of other diseases of the musculoskeletal system and connective tissue: Secondary | ICD-10-CM | POA: Diagnosis not present

## 2015-01-16 DIAGNOSIS — M79641 Pain in right hand: Secondary | ICD-10-CM | POA: Diagnosis not present

## 2015-01-16 DIAGNOSIS — Z79899 Other long term (current) drug therapy: Secondary | ICD-10-CM | POA: Diagnosis not present

## 2015-01-16 DIAGNOSIS — L03011 Cellulitis of right finger: Secondary | ICD-10-CM | POA: Diagnosis not present

## 2015-01-16 DIAGNOSIS — M79644 Pain in right finger(s): Secondary | ICD-10-CM | POA: Diagnosis present

## 2015-01-16 HISTORY — DX: Essential (primary) hypertension: I10

## 2015-01-16 MED ORDER — BACITRACIN ZINC 500 UNIT/GM EX OINT
TOPICAL_OINTMENT | Freq: Two times a day (BID) | CUTANEOUS | Status: DC
  Administered 2015-01-16: 10:00:00 via TOPICAL

## 2015-01-16 MED ORDER — TETANUS-DIPHTH-ACELL PERTUSSIS 5-2.5-18.5 LF-MCG/0.5 IM SUSP
0.5000 mL | Freq: Once | INTRAMUSCULAR | Status: AC
  Administered 2015-01-16: 0.5 mL via INTRAMUSCULAR
  Filled 2015-01-16: qty 0.5

## 2015-01-16 MED ORDER — BUPIVACAINE HCL (PF) 0.5 % IJ SOLN
10.0000 mL | Freq: Once | INTRAMUSCULAR | Status: AC
  Administered 2015-01-16: 10 mL
  Filled 2015-01-16: qty 10

## 2015-01-16 MED ORDER — CEPHALEXIN 250 MG PO CAPS
500.0000 mg | ORAL_CAPSULE | Freq: Once | ORAL | Status: AC
  Administered 2015-01-16: 500 mg via ORAL
  Filled 2015-01-16: qty 2

## 2015-01-16 MED ORDER — CEPHALEXIN 500 MG PO CAPS: 500.0000 mg | ORAL_CAPSULE | Freq: Four times a day (QID) | ORAL | Status: DC

## 2015-01-16 MED ORDER — KETOROLAC TROMETHAMINE 60 MG/2ML IM SOLN
60.0000 mg | Freq: Once | INTRAMUSCULAR | Status: AC
  Administered 2015-01-16: 60 mg via INTRAMUSCULAR
  Filled 2015-01-16: qty 2

## 2015-01-16 MED ORDER — IBUPROFEN 800 MG PO TABS: 800.0000 mg | ORAL_TABLET | Freq: Three times a day (TID) | ORAL | Status: DC

## 2015-01-16 MED ORDER — OXYCODONE-ACETAMINOPHEN 5-325 MG PO TABS: 1.0000 | ORAL_TABLET | ORAL | Status: DC | PRN

## 2015-01-16 NOTE — ED Notes (Signed)
Declined W/C at D/C and was escorted to lobby by RN. 

## 2015-01-16 NOTE — Discharge Instructions (Signed)
You have been seen today for thumb pain and swelling. Your imaging showed no abnormalities. Follow up with PCP as needed. Return to ED should symptoms worsen or you have signs of a worsening infection including significantly increased pain, numbness, tingling, weakness, red streaking on the skin, redness spreading down the thumb, or any other major concerns. Keep the incision site clean and dry. Follow the attached instructions for wound care. Take ibuprofen or Tylenol for pain. Stop taking the antibiotic prescribed last night and begin taking the new dosage of antibiotic prescribed today.   Emergency Department Resource Guide 1) Find a Doctor and Pay Out of Pocket Although you won't have to find out who is covered by your insurance plan, it is a good idea to ask around and get recommendations. You will then need to call the office and see if the doctor you have chosen will accept you as a new patient and what types of options they offer for patients who are self-pay. Some doctors offer discounts or will set up payment plans for their patients who do not have insurance, but you will need to ask so you aren't surprised when you get to your appointment.  2) Contact Your Local Health Department Not all health departments have doctors that can see patients for sick visits, but many do, so it is worth a call to see if yours does. If you don't know where your local health department is, you can check in your phone book. The CDC also has a tool to help you locate your state's health department, and many state websites also have listings of all of their local health departments.  3) Find a Walk-in Clinic If your illness is not likely to be very severe or complicated, you may want to try a walk in clinic. These are popping up all over the country in pharmacies, drugstores, and shopping centers. They're usually staffed by nurse practitioners or physician assistants that have been trained to treat common illnesses  and complaints. They're usually fairly quick and inexpensive. However, if you have serious medical issues or chronic medical problems, these are probably not your best option.  No Primary Care Doctor: - Call Health Connect at  [phone] - they can help you locate a primary care doctor that  accepts your insurance, provides certain services, etc. - Physician Referral Service- [phone]  Chronic Pain Problems: Organization         Address  Phone   Notes  [facility]  [phone] Patients need to be referred by their primary care doctor.   Medication Assistance: Organization         Address  Phone   Notes  [facility] [address] [place], [place] [address] [phone] --Must be a resident of [place] [facility] [place] -- Must have NO insurance coverage whatsoever (no Medicaid/ Medicare, etc.) -- The pt. MUST have a primary care doctor that directs their care regularly and follows them in the community   MedAssist  [phone]   [employer]  [phone]    Agencies that provide inexpensive medical care: Organization         Address  Phone   Notes  [facility]  [phone]   [facility]    [phone]   [facility] [address] [place], [place] [address] [phone]   [facility] [address], [place] [phone]  Planned Parenthood    [phone]   [facility]    [phone]   Community Health and [place] [address], [place] Phone:  [phone], Fax:  [phone] Hours of Operation:  9 am - 6 pm, M-F.  Also accepts Medicaid/Medicare and self-pay.  [facility] for [place], [address], [place] Phone: [phone], Fax: [phone]. Hours of Operation:  8:30 am - 5:30 pm, M-F.  Also accepts Medicaid and self-pay.  [facility] [address], [place] Phone: [phone]   [place], [place], [place] [phone], Ext. [phone] Mondays & Thursdays: 7-9 AM.  First 15 patients are seen on a first come, first serve basis.    [place] Providers:  Organization         Address  Phone   Notes  [facility] [address], [place] [phone] Also accepts self-pay patients.  [facility] [address], [place]  [phone]   [place], [address], [place] [phone]   [facility] [address] [place] [phone]   Lucianne Lei [address], [place]   [phone] Only accepts [place] Access [place] patients after they have their name applied to their card.   Self-Pay (no insurance) in [facility]:  Organization         Address  Phone   Notes  Sickle Cell Patients, [facility] [place] [phone]   [facility] [place] [phone]   [facility] [place]  [place], [place], [place] [phone]   [facility]/Dr. Osei-Bonsu  [address], [place] or [address], [place] [phone] Phone number for both [place] and [place] locations is the same.  [facility]gent [facility] and [facility] [address], [place] [phone]   [facility] [address], [place] or [address] [phone]   [facility] [address] [place] [phone], phone; [phone], fax Sees patients 1st and 3rd Saturday of every month.  Must not qualify for public or private insurance (i.e. Medicaid, Medicare, [place] Health Choice, Veterans' Benefits)  Household income should be no more than 200% of the poverty level  The clinic cannot treat you if you are pregnant or think you are pregnant  Sexually transmitted diseases are not treated at the clinic.    Dental Care: Organization         Address  Phone  Notes  [facility] Department of [facility] [phone] Accepts children up to age 26 who are enrolled in [place] or [place]; pregnant women with a Medicaid card; and children who have applied for Medicaid or [place] Health Choice, but were declined, whose parents can pay a reduced fee at time of service.  [facility] Department of [place] [facility]  [address], [place] [phone] Accepts children up to age 44 who are enrolled in [place] or [place]; pregnant women with a Medicaid card; and children who have applied for Medicaid or [place], but were declined,  whose parents can pay a reduced fee at time of service.  [facility] Adult Dental Access PROGRAM  [place] [phone] Patients are seen by appointment only. Walk-ins are not accepted. [place] will see patients 21 years of age and older. Monday - Tuesday (8am-5pm) Most Wednesdays (8:30-5pm) $30 per visit, cash only  [facility] Adult Dental Access PROGRAM  [address], [facility] [phone] Patients are seen by appointment only. Walk-ins are not accepted. [place] will see patients 70 years of age and older. One Wednesday Evening (Monthly: Volunteer Based).  $30 per visit, cash only  [place]  [phone] for adults; Children under age 28, call Graduate Pediatric Dentistry at [phone]. Children aged 8-14, please call [phone] to request a pediatric application.  Dental services are provided in all areas of dental care including fillings, crowns and bridges, complete and partial dentures, implants, gum treatment, root canals, and extractions. Preventive care is  also provided. Treatment is provided to both adults and children. Patients are selected via a lottery and there is often a waiting list.   [facility] [address] [place]  [phone] [email or web address]   [facility] [address] [place], [place] [phone], Ext. [phone] Second and Fourth Thursday of each month, opens at 6:30 AM; Clinic ends at 9 AM.  Patients are seen on a first-come first-served basis, and a limited number are seen during each clinic.   [facility]  [address] Hillard Danker [place], [place] [phone]   Eligibility Requirements You must have lived in [place], [place], or [place] counties for at least the last three months.   You cannot be eligible for state or federal sponsored [employer], including [employer], [place], or [employer].   You generally cannot be eligible for healthcare insurance through your employer.    How to apply: Eligibility screenings are held every Tuesday and Wednesday afternoon from 1:00 pm until 4:00 pm. You do not need an appointment for the interview!  [facility] [address], [place], [place]   [place]  [place] Department  [place]  [phone]    Behavioral Health Resources in the Community: Intensive Outpatient Programs Organization         Address  Phone  Notes  [place]. [address], [place], [place] [phone]   [facility]patient [address] [place], [place]   ADS: Alcohol & Drug Svcs [address] [place], [place]   [place] [address]  [place], [place] or [phone]   Substance Abuse Resources Organization         Address  Phone  Notes  Alcohol and Drug Services  [phone]   [place]  [phone]   [facility] [place]   Chinita Pester  [phone]   Residential & Outpatient Substance Abuse Program  [phone]   Psychological Services Organization         Address  Phone  Notes  [facility] [place]  [place]  [phone]   [place] [address] [place] or [phone]    Mobile Crisis Teams Organization         Address  Phone  Notes  [facility]rapeutic Alternatives, Mobile  Crisis Care Unit  [phone]   Assertive Psychotherapeutic Services  [address] [place], [place]   [facility] [address], [place] [phone]    Self-Help/Support Groups Organization         Address  Phone             Notes  [place]. of [place] - variety of support groups  [place] Call for more information  Narcotics Anonymous (NA), [facility] [address], [employer] [place]  2 meetings at this location   Special educational needs teacher         Address  Phone  Notes  ASAP Residential Treatment [place],    [place]  [phone]   [facility]  [address], [place] [address]191, [place], [place]   [place], [place] [phone] Admissions: 8am-3pm M-F  Incentives Substance [place] [address]    [place], [place] [phone]   [facility] [address] [place], [place], [place]   [facility] [address]  [place], [place]   Insight Programs - Intensive Outpatient [address], [place], [place]   [facility].,  [place], [place] [phone] or [phone]   [facility]idential Treatment Services (RTS) [address] [place], [place] Accepts Medicaid  [facility] [address],  [place] [phone]  Substance Abuse/Addiction Treatment   [address] [facility] Organization         Address  Phone  Notes  [facility]  [phone]   Domenic Schwab, PhD [address] Arlis Porta [place], [place]   [phone] or [phone]   [place], [place] [phone]   [facility] [address], [place], [place] [phone] Insurance/Medicaid/sponsorship through [facility] and Families [address] [place]                                    [place], [place] [phone] [place] [address], [place] [phone]    Dr. Adele Schilder  [phone]   [facility] [place] Dept. 1) [address] [place] 2) [place] 3)  [place] [address], [place] [phone]  [phone]   [place] [phone] or [phone] (After Hours)

## 2015-01-16 NOTE — ED Provider Notes (Signed)
CSN: [id number]     Arrival date & time 01/16/15  0746 History   First MD Initiated Contact with Patient 01/16/15 0800     Chief Complaint  Patient presents with  . Finger Injury     (Consider location/radiation/quality/duration/timing/severity/associated sxs/prior Treatment) HPI   Randy Melton is a 59 y.o. male, with a history of arthritis and hypertension, presenting to the ED with right thumb pain and swelling for the past two days. Pt was seen at [facility] last night and was prescribed cephalexin  PO TID and given instructions for soaking. Pt has tried ibuprofen for it with minimal relief. Pt has not yet had an xray of the thumb. Rates the pain 9/10, throbbing, non-radiating. Patient adds, "it feels like something is underneath the nail." Pt states the pain has interrupted his sleep. Denies fever/chills, N/V, neuro deficits, hand pain, or any other pain or complaints.   Past Medical History  Diagnosis Date  . Arthritis   . Hypertension    Past Surgical History  Procedure Laterality Date  . Colon surgery    . Knee surgery    . Hernia repair     No family history on file. Social History  Substance Use Topics  . Smoking status: Never Smoker   . Smokeless tobacco: None  . Alcohol Use: Yes     Comment: 2 CUPS OF WINE 3 DAYS A WEEK    Review of Systems  Constitutional: Negative for fever and chills.  Gastrointestinal: Negative for nausea and vomiting.  Musculoskeletal:       Right thumb pain and swelling.  Skin: Negative for color change and wound.  Neurological: Negative for weakness and numbness.      Allergies  Review of patient's allergies indicates no known allergies.  Home Medications   Prior to Admission medications   Medication Sig Start Date End Date Taking? Authorizing Provider  cephALEXin (KEFLEX) 500 MG capsule Take 1 capsule (500 mg total) by mouth 4 (four) times daily. 01/16/15   Suzette Flagler C Loyda Costin, PA-C  fish oil-omega-3 fatty acids 1000 MG capsule Take  2 g by mouth daily.      Historical Provider, MD  ibuprofen (ADVIL,MOTRIN) 800 MG tablet Take 1 tablet (800 mg total) by mouth 3 (three) times daily. 01/16/15   Luree Palla C Darryon Bastin, PA-C  Multiple Vitamin (MULTIVITAMIN) tablet Take 1 tablet by mouth daily.      Historical Provider, MD  oxyCODONE-acetaminophen (PERCOCET/ROXICET) 5-325 MG tablet Take 1-2 tablets by mouth every 4 (four) hours as needed for severe pain. 01/16/15   Mallarie Voorhies C Eean Buss, PA-C  Valsartan (DIOVAN PO) Take by mouth.      Historical Provider, MD   BP 119/75 mmHg  Pulse 62  Temp(Src) 98 F (36.7 C) (Oral)  Resp 16  Ht  (1.753 m)  Wt 81.647 kg  BMI 26.57 kg/m2  SpO2 100% Physical Exam  Constitutional: He appears well-developed and well-nourished. No distress.  HENT:  Head: Normocephalic and atraumatic.  Eyes: Conjunctivae are normal. Pupils are equal, round, and reactive to light.  Cardiovascular: Normal rate and regular rhythm.   Pulmonary/Chest: Effort normal.  Musculoskeletal:  Full ROM in right hand and thumb. Obvious swelling noted to right thumb. Tenderness to lateral right thumb with possible fluid collection present.   Neurological: He is alert.  Skin: Skin is dry. He is not diaphoretic.  Nursing note and vitals reviewed.   ED Course  .Marland KitchenIncision and Drainage Date/Time: 01/16/2015 9:28 AM Performed by: Anselm Pancoast Authorized  by: Harolyn RutherfordJOY, Malary Aylesworth C Consent: Verbal consent obtained. Risks and benefits: risks, benefits and alternatives were discussed Consent given by: patient Patient understanding: patient states understanding of the procedure being performed Patient consent: the patient's understanding of the procedure matches consent given Procedure consent: procedure consent matches procedure scheduled Patient identity confirmed: verbally with patient and arm band Time out: Immediately prior to procedure a "time out" was called to verify the correct patient, procedure, equipment, support staff and site/side marked  as required. Type: abscess Body area: upper extremity Location details: right thumb Anesthesia: digital block Local anesthetic: bupivacaine 0.5% with epinephrine Anesthetic total: 3 ml Patient sedated: no Scalpel size: 11 Incision type: single straight Incision depth: subcutaneous Complexity: simple Drainage: purulent and  bloody Drainage amount: scant Wound treatment: wound left open Patient tolerance: Patient tolerated the procedure well with no immediate complications   (including critical care time) Labs Review Labs Reviewed - No data to display  Imaging Review Dg Hand Complete Right  01/16/2015  CLINICAL DATA:  Swelling and pain of the right hand.  No injury. EXAM: RIGHT HAND - COMPLETE 3+ VIEW COMPARISON:  None. FINDINGS: There is no evidence of fracture or dislocation. There are degenerative joint changes of the carpal bones and of the radial carpal joint. Soft tissues are unremarkable. IMPRESSION: No acute fracture or dislocation. 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 Electronically Signed   By: Sherian ReinWei-Chen  Lin M.D.   On: 01/16/2015 09:05   I have personally reviewed and evaluated these images as part of my medical decision-making.   EKG Interpretation None      MDM   Final diagnoses:  Paronychia of right thumb    Randy Melton presents with right thumb pain and swelling for the last 2 days.  Suspect the patient has a paronychia. Area was drained successfully. Patient's antibiotics updated with proper dosage. Patient has no functional or neurologic deficits. X-ray showed no acute abnormalities. Patient was given home care instructions and return precautions. Patient voiced understanding of these instructions, excepted the plan, and is comfortable with discharge.  Anselm PancoastShawn C Tex Conroy, PA-C 01/16/15 1009  Doug SouSam Jacubowitz, MD 01/16/15 1801

## 2015-01-16 NOTE — ED Notes (Signed)
Pt was [facility] and [facility]. Pt continues to have unrelieved pain to RT thumb. Pt is currently taking cephalexin  250mg  PO TID.

## 2015-01-16 NOTE — ED Notes (Signed)
Right thumb swollen, throbbing, states "I think I have something stuck underneath the nail" was seen at urgent care last night.

## 2015-02-01 ENCOUNTER — Encounter: Payer: Medicare Other | Admitting: Neurology

## 2015-02-03 ENCOUNTER — Telehealth: Payer: Self-pay

## 2015-02-03 NOTE — Telephone Encounter (Signed)
I called the patient to r/s appointment from 02/01/15. He told me that he already had his EMG/NVC done at another office and does not need to come to our office for this test.

## 2015-03-11 DIAGNOSIS — M130 Polyarthritis, unspecified: Secondary | ICD-10-CM | POA: Diagnosis not present

## 2015-03-11 DIAGNOSIS — I10 Essential (primary) hypertension: Secondary | ICD-10-CM | POA: Diagnosis not present

## 2015-06-16 DIAGNOSIS — E7800 Pure hypercholesterolemia, unspecified: Secondary | ICD-10-CM | POA: Diagnosis not present

## 2015-06-16 DIAGNOSIS — M130 Polyarthritis, unspecified: Secondary | ICD-10-CM | POA: Diagnosis not present

## 2015-06-16 DIAGNOSIS — I10 Essential (primary) hypertension: Secondary | ICD-10-CM | POA: Diagnosis not present

## 2015-06-16 DIAGNOSIS — F419 Anxiety disorder, unspecified: Secondary | ICD-10-CM | POA: Diagnosis not present

## 2015-06-16 DIAGNOSIS — Z6826 Body mass index (BMI) 26.0-26.9, adult: Secondary | ICD-10-CM | POA: Diagnosis not present

## 2015-06-16 DIAGNOSIS — N400 Enlarged prostate without lower urinary tract symptoms: Secondary | ICD-10-CM | POA: Diagnosis not present

## 2015-06-23 DIAGNOSIS — M67912 Unspecified disorder of synovium and tendon, left shoulder: Secondary | ICD-10-CM | POA: Diagnosis not present

## 2015-06-23 DIAGNOSIS — M19211 Secondary osteoarthritis, right shoulder: Secondary | ICD-10-CM | POA: Diagnosis not present

## 2015-06-24 DIAGNOSIS — H2513 Age-related nuclear cataract, bilateral: Secondary | ICD-10-CM | POA: Diagnosis not present

## 2015-06-24 DIAGNOSIS — H35373 Puckering of macula, bilateral: Secondary | ICD-10-CM | POA: Diagnosis not present

## 2015-07-09 DIAGNOSIS — M75121 Complete rotator cuff tear or rupture of right shoulder, not specified as traumatic: Secondary | ICD-10-CM | POA: Diagnosis not present

## 2015-07-09 DIAGNOSIS — M67912 Unspecified disorder of synovium and tendon, left shoulder: Secondary | ICD-10-CM | POA: Diagnosis not present

## 2015-07-10 ENCOUNTER — Other Ambulatory Visit: Payer: Self-pay | Admitting: Orthopedic Surgery

## 2015-07-10 DIAGNOSIS — M67912 Unspecified disorder of synovium and tendon, left shoulder: Secondary | ICD-10-CM

## 2015-07-20 ENCOUNTER — Ambulatory Visit
Admission: RE | Admit: 2015-07-20 | Discharge: 2015-07-20 | Disposition: A | Discharge status: Home / Self Care | Payer: Medicare Other | Source: Ambulatory Visit | Attending: Orthopedic Surgery | Admitting: Orthopedic Surgery

## 2015-07-20 DIAGNOSIS — M67912 Unspecified disorder of synovium and tendon, left shoulder: Secondary | ICD-10-CM

## 2015-08-11 DIAGNOSIS — M24212 Disorder of ligament, left shoulder: Secondary | ICD-10-CM | POA: Diagnosis not present

## 2015-08-11 DIAGNOSIS — M19012 Primary osteoarthritis, left shoulder: Secondary | ICD-10-CM | POA: Diagnosis not present

## 2015-08-11 DIAGNOSIS — M25412 Effusion, left shoulder: Secondary | ICD-10-CM | POA: Diagnosis not present

## 2015-08-11 DIAGNOSIS — M75122 Complete rotator cuff tear or rupture of left shoulder, not specified as traumatic: Secondary | ICD-10-CM | POA: Diagnosis not present

## 2015-08-16 DIAGNOSIS — M75122 Complete rotator cuff tear or rupture of left shoulder, not specified as traumatic: Secondary | ICD-10-CM | POA: Diagnosis not present

## 2015-08-31 DIAGNOSIS — G8918 Other acute postprocedural pain: Secondary | ICD-10-CM | POA: Diagnosis not present

## 2015-08-31 DIAGNOSIS — M24112 Other articular cartilage disorders, left shoulder: Secondary | ICD-10-CM | POA: Diagnosis not present

## 2015-08-31 DIAGNOSIS — M75102 Unspecified rotator cuff tear or rupture of left shoulder, not specified as traumatic: Secondary | ICD-10-CM | POA: Diagnosis not present

## 2015-08-31 DIAGNOSIS — M7542 Impingement syndrome of left shoulder: Secondary | ICD-10-CM | POA: Diagnosis not present

## 2015-08-31 DIAGNOSIS — M75122 Complete rotator cuff tear or rupture of left shoulder, not specified as traumatic: Secondary | ICD-10-CM | POA: Diagnosis not present

## 2015-09-08 DIAGNOSIS — Z9889 Other specified postprocedural states: Secondary | ICD-10-CM | POA: Diagnosis not present

## 2015-09-20 DIAGNOSIS — M75122 Complete rotator cuff tear or rupture of left shoulder, not specified as traumatic: Secondary | ICD-10-CM | POA: Diagnosis not present

## 2015-09-20 DIAGNOSIS — M6281 Muscle weakness (generalized): Secondary | ICD-10-CM | POA: Diagnosis not present

## 2015-10-04 DIAGNOSIS — Z9889 Other specified postprocedural states: Secondary | ICD-10-CM | POA: Diagnosis not present

## 2015-10-28 DIAGNOSIS — I10 Essential (primary) hypertension: Secondary | ICD-10-CM | POA: Diagnosis not present

## 2015-10-28 DIAGNOSIS — Z125 Encounter for screening for malignant neoplasm of prostate: Secondary | ICD-10-CM | POA: Diagnosis not present

## 2015-10-28 DIAGNOSIS — M130 Polyarthritis, unspecified: Secondary | ICD-10-CM | POA: Diagnosis not present

## 2015-10-28 DIAGNOSIS — Z0000 Encounter for general adult medical examination without abnormal findings: Secondary | ICD-10-CM | POA: Diagnosis not present

## 2016-02-22 DIAGNOSIS — M130 Polyarthritis, unspecified: Secondary | ICD-10-CM | POA: Diagnosis not present

## 2016-02-22 DIAGNOSIS — M5430 Sciatica, unspecified side: Secondary | ICD-10-CM | POA: Diagnosis not present

## 2016-03-28 DIAGNOSIS — M5430 Sciatica, unspecified side: Secondary | ICD-10-CM | POA: Diagnosis not present

## 2016-03-28 DIAGNOSIS — M130 Polyarthritis, unspecified: Secondary | ICD-10-CM | POA: Diagnosis not present

## 2016-05-10 DIAGNOSIS — Z0000 Encounter for general adult medical examination without abnormal findings: Secondary | ICD-10-CM | POA: Diagnosis not present

## 2016-07-19 DIAGNOSIS — H35372 Puckering of macula, left eye: Secondary | ICD-10-CM | POA: Diagnosis not present

## 2016-07-19 DIAGNOSIS — H2513 Age-related nuclear cataract, bilateral: Secondary | ICD-10-CM | POA: Diagnosis not present

## 2016-09-12 DIAGNOSIS — G5601 Carpal tunnel syndrome, right upper limb: Secondary | ICD-10-CM | POA: Diagnosis not present

## 2016-09-12 DIAGNOSIS — G5602 Carpal tunnel syndrome, left upper limb: Secondary | ICD-10-CM | POA: Diagnosis not present

## 2016-09-12 DIAGNOSIS — M130 Polyarthritis, unspecified: Secondary | ICD-10-CM | POA: Diagnosis not present

## 2017-01-03 DIAGNOSIS — Z6828 Body mass index (BMI) 28.0-28.9, adult: Secondary | ICD-10-CM | POA: Diagnosis not present

## 2017-01-03 DIAGNOSIS — M130 Polyarthritis, unspecified: Secondary | ICD-10-CM | POA: Diagnosis not present

## 2017-01-03 DIAGNOSIS — G5601 Carpal tunnel syndrome, right upper limb: Secondary | ICD-10-CM | POA: Diagnosis not present

## 2017-01-03 DIAGNOSIS — Z125 Encounter for screening for malignant neoplasm of prostate: Secondary | ICD-10-CM | POA: Diagnosis not present

## 2017-01-03 DIAGNOSIS — I10 Essential (primary) hypertension: Secondary | ICD-10-CM | POA: Diagnosis not present

## 2017-05-03 DIAGNOSIS — M130 Polyarthritis, unspecified: Secondary | ICD-10-CM | POA: Diagnosis not present

## 2017-05-03 DIAGNOSIS — I10 Essential (primary) hypertension: Secondary | ICD-10-CM | POA: Diagnosis not present

## 2017-07-25 DIAGNOSIS — M130 Polyarthritis, unspecified: Secondary | ICD-10-CM | POA: Diagnosis not present

## 2017-07-25 DIAGNOSIS — K5700 Diverticulitis of small intestine with perforation and abscess without bleeding: Secondary | ICD-10-CM | POA: Diagnosis not present

## 2017-07-25 DIAGNOSIS — Z6827 Body mass index (BMI) 27.0-27.9, adult: Secondary | ICD-10-CM | POA: Diagnosis not present

## 2017-07-25 DIAGNOSIS — I10 Essential (primary) hypertension: Secondary | ICD-10-CM | POA: Diagnosis not present

## 2017-07-31 DIAGNOSIS — R1030 Lower abdominal pain, unspecified: Secondary | ICD-10-CM | POA: Diagnosis not present

## 2017-08-01 ENCOUNTER — Ambulatory Visit
Admission: RE | Admit: 2017-08-01 | Discharge: 2017-08-01 | Disposition: A | Discharge status: Home / Self Care | Payer: Medicare Other | Source: Ambulatory Visit | Attending: Family Medicine | Admitting: Family Medicine

## 2017-08-01 ENCOUNTER — Encounter: Payer: Self-pay | Admitting: Radiology

## 2017-08-01 ENCOUNTER — Other Ambulatory Visit: Payer: Self-pay | Admitting: Family Medicine

## 2017-08-01 DIAGNOSIS — R10814 Left lower quadrant abdominal tenderness: Secondary | ICD-10-CM

## 2017-08-01 DIAGNOSIS — K5792 Diverticulitis of intestine, part unspecified, without perforation or abscess without bleeding: Secondary | ICD-10-CM

## 2017-08-01 DIAGNOSIS — K5730 Diverticulosis of large intestine without perforation or abscess without bleeding: Secondary | ICD-10-CM | POA: Diagnosis not present

## 2017-08-01 MED ORDER — IOPAMIDOL (ISOVUE-300) INJECTION 61%
100.0000 mL | Freq: Once | INTRAVENOUS | Status: AC | PRN
  Administered 2017-08-01: 100 mL via INTRAVENOUS

## 2017-08-02 DIAGNOSIS — R1030 Lower abdominal pain, unspecified: Secondary | ICD-10-CM | POA: Diagnosis not present

## 2017-08-03 DIAGNOSIS — H25813 Combined forms of age-related cataract, bilateral: Secondary | ICD-10-CM | POA: Diagnosis not present

## 2017-08-09 DIAGNOSIS — M25511 Pain in right shoulder: Secondary | ICD-10-CM | POA: Diagnosis not present

## 2017-08-20 ENCOUNTER — Other Ambulatory Visit: Payer: Self-pay | Admitting: Orthopedic Surgery

## 2017-08-20 DIAGNOSIS — M25511 Pain in right shoulder: Secondary | ICD-10-CM

## 2017-08-28 ENCOUNTER — Inpatient Hospital Stay
Admission: RE | Admit: 2017-08-28 | Discharge: 2017-08-28 | Disposition: A | Discharge status: Home / Self Care | Payer: Medicare Other | Source: Ambulatory Visit | Attending: Orthopedic Surgery | Admitting: Orthopedic Surgery

## 2017-08-31 ENCOUNTER — Telehealth: Payer: Self-pay | Admitting: Nurse Practitioner

## 2017-08-31 ENCOUNTER — Encounter: Payer: Self-pay | Admitting: Nurse Practitioner

## 2017-08-31 NOTE — Telephone Encounter (Signed)
New hematology referral received from Dr. Parke SimmersBland for decreased wbc. Pt has been scheduled to see Santiago GladLacie Burton on 8/26 at 120pm. Pt aware to arrive 30 minutes early. Letter mailed.

## 2017-09-17 ENCOUNTER — Encounter: Payer: Self-pay | Admitting: Nurse Practitioner

## 2017-09-17 ENCOUNTER — Inpatient Hospital Stay: Payer: Medicare Other | Attending: Nurse Practitioner | Admitting: Nurse Practitioner

## 2017-09-17 ENCOUNTER — Telehealth: Payer: Self-pay | Admitting: Hematology

## 2017-09-17 ENCOUNTER — Inpatient Hospital Stay: Payer: Medicare Other

## 2017-09-17 VITALS — BP 137/90 | HR 59 | Temp 98.3°F | Resp 18 | Ht 69.0 in | Wt 181.2 lb

## 2017-09-17 DIAGNOSIS — I10 Essential (primary) hypertension: Secondary | ICD-10-CM | POA: Diagnosis not present

## 2017-09-17 DIAGNOSIS — Z87891 Personal history of nicotine dependence: Secondary | ICD-10-CM | POA: Insufficient documentation

## 2017-09-17 DIAGNOSIS — M19019 Primary osteoarthritis, unspecified shoulder: Secondary | ICD-10-CM | POA: Insufficient documentation

## 2017-09-17 DIAGNOSIS — Z8719 Personal history of other diseases of the digestive system: Secondary | ICD-10-CM | POA: Insufficient documentation

## 2017-09-17 DIAGNOSIS — M19039 Primary osteoarthritis, unspecified wrist: Secondary | ICD-10-CM | POA: Insufficient documentation

## 2017-09-17 DIAGNOSIS — Z79899 Other long term (current) drug therapy: Secondary | ICD-10-CM | POA: Diagnosis not present

## 2017-09-17 DIAGNOSIS — D708 Other neutropenia: Secondary | ICD-10-CM | POA: Diagnosis not present

## 2017-09-17 LAB — CMP (CANCER CENTER ONLY)
ALT: 18 U/L (ref 0–44)
AST: 19 U/L (ref 15–41)
Albumin: 4.5 g/dL (ref 3.5–5.0)
Alkaline Phosphatase: 82 U/L (ref 38–126)
Anion gap: 8 (ref 5–15)
BILIRUBIN TOTAL: 1 mg/dL (ref 0.3–1.2)
BUN: 12 mg/dL (ref 8–23)
CALCIUM: 10.2 mg/dL (ref 8.9–10.3)
CO2: 27 mmol/L (ref 22–32)
Chloride: 105 mmol/L (ref 98–111)
Creatinine: 1.09 mg/dL (ref 0.61–1.24)
GFR, Estimated: >60 mL/min (ref >60)
Glucose, Bld: 94 mg/dL (ref 70–99)
POTASSIUM: 4.1 mmol/L (ref 3.5–5.1)
SODIUM: 140 mmol/L (ref 135–145)
TOTAL PROTEIN: 8.7 g/dL — AB (ref 6.5–8.1)

## 2017-09-17 LAB — CBC WITH DIFFERENTIAL (CANCER CENTER ONLY)
BASOS ABS: 0 10*3/uL (ref 0.0–0.1)
BASOS PCT: 1 %
EOS PCT: 3 %
Eosinophils Absolute: 0.1 10*3/uL (ref 0.0–0.5)
HCT: 40.2 % (ref 38.4–49.9)
Hemoglobin: 13.7 g/dL (ref 13.0–17.1)
Lymphocytes Relative: 37 %
Lymphs Abs: 1.4 10*3/uL (ref 0.9–3.3)
MCH: 30.3 pg (ref 27.2–33.4)
MCHC: 34.1 g/dL (ref 32.0–36.0)
MCV: 88.9 fL (ref 79.3–98.0)
MONO ABS: 0.5 10*3/uL (ref 0.1–0.9)
Monocytes Relative: 12 %
Neutro Abs: 1.8 10*3/uL (ref 1.5–6.5)
Neutrophils Relative %: 47 %
PLATELETS: 227 10*3/uL (ref 140–400)
RBC: 4.52 MIL/uL (ref 4.20–5.82)
RDW: 14.1 % (ref 11.0–14.6)
WBC: 3.9 10*3/uL — AB (ref 4.0–10.3)

## 2017-09-17 LAB — VITAMIN B12: Vitamin B-12: 903 pg/mL (ref 180–914)

## 2017-09-17 LAB — C-REACTIVE PROTEIN: CRP: <0.8 mg/dL (ref <1.0)

## 2017-09-17 LAB — TECHNOLOGIST SMEAR REVIEW

## 2017-09-17 LAB — FOLATE: FOLATE: 29.6 ng/mL (ref >5.9)

## 2017-09-17 NOTE — Telephone Encounter (Signed)
Appts scheduled AVS/Calendar printed per 8/26 los °
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
## 2017-09-18 ENCOUNTER — Telehealth: Payer: Self-pay

## 2017-09-18 LAB — ANTINUCLEAR ANTIBODIES, IFA: ANTINUCLEAR ANTIBODIES, IFA: NEGATIVE

## 2017-09-18 LAB — HEPATITIS C ANTIBODY: HCV Ab: <0.1 s/co ratio (ref 0.0–0.9)

## 2017-09-18 LAB — HEPATITIS B SURFACE ANTIGEN: Hepatitis B Surface Ag: NEGATIVE

## 2017-09-18 NOTE — Telephone Encounter (Signed)
Provided previous call information to Mr. Randy Melton.  Expressed "happy to learn he is negative for anything and blood counts are better".

## 2017-09-18 NOTE — Telephone Encounter (Signed)
-----  Message from Alla Feeling, NP sent at 09/18/2017  2:12 PM EDT ----- Please let him know lab results. White count is 3.9, which is almost normal. B12, folate, hepatitis panel, inflammatory, and autoimmune markers are all normal. Will repeat CBC again in 3 months as discussed. No need for bone marrow biopsy for now.  Thanks, Regan Rakers NP

## 2017-09-18 NOTE — Telephone Encounter (Signed)
Left voice message for patient with lab results per Cira Rue NP, white count is 3.9 which is almost normal, B12, folate, hepatitis panel, inflammatory and autoimmune markers are all normal.  We will repeat CBC in 3 months as discussed. There is no need for bone marrow biopsy at this time.

## 2017-09-19 LAB — METHYLMALONIC ACID, SERUM: Methylmalonic Acid, Quantitative: 176 nmol/L (ref 0–378)

## 2017-10-25 DIAGNOSIS — N400 Enlarged prostate without lower urinary tract symptoms: Secondary | ICD-10-CM | POA: Diagnosis not present

## 2017-10-25 DIAGNOSIS — I10 Essential (primary) hypertension: Secondary | ICD-10-CM | POA: Diagnosis not present

## 2017-10-25 DIAGNOSIS — Z6827 Body mass index (BMI) 27.0-27.9, adult: Secondary | ICD-10-CM | POA: Diagnosis not present

## 2017-10-25 DIAGNOSIS — M130 Polyarthritis, unspecified: Secondary | ICD-10-CM | POA: Diagnosis not present

## 2017-10-25 DIAGNOSIS — Z008 Encounter for other general examination: Secondary | ICD-10-CM | POA: Diagnosis not present

## 2017-12-18 ENCOUNTER — Inpatient Hospital Stay: Payer: Medicare Other | Attending: Nurse Practitioner

## 2017-12-18 DIAGNOSIS — D708 Other neutropenia: Secondary | ICD-10-CM | POA: Diagnosis not present

## 2017-12-18 LAB — CBC WITH DIFFERENTIAL (CANCER CENTER ONLY)
Abs Immature Granulocytes: 0.01 10*3/uL (ref 0.00–0.07)
BASOS ABS: 0 10*3/uL (ref 0.0–0.1)
BASOS PCT: 1 %
Eosinophils Absolute: 0.1 10*3/uL (ref 0.0–0.5)
Eosinophils Relative: 3 %
HCT: 40.1 % (ref 39.0–52.0)
HEMOGLOBIN: 13.3 g/dL (ref 13.0–17.0)
Immature Granulocytes: 0 %
LYMPHS PCT: 51 %
Lymphs Abs: 2.1 10*3/uL (ref 0.7–4.0)
MCH: 30.2 pg (ref 26.0–34.0)
MCHC: 33.2 g/dL (ref 30.0–36.0)
MCV: 91.1 fL (ref 80.0–100.0)
Monocytes Absolute: 0.6 10*3/uL (ref 0.1–1.0)
Monocytes Relative: 14 %
NEUTROS ABS: 1.3 10*3/uL — AB (ref 1.7–7.7)
NRBC: 0 % (ref 0.0–0.2)
Neutrophils Relative %: 31 %
PLATELETS: 248 10*3/uL (ref 150–400)
RBC: 4.4 MIL/uL (ref 4.22–5.81)
RDW: 12.6 % (ref 11.5–15.5)
WBC: 4.1 10*3/uL (ref 4.0–10.5)

## 2017-12-24 ENCOUNTER — Telehealth: Payer: Self-pay | Admitting: Emergency Medicine

## 2017-12-24 NOTE — Telephone Encounter (Addendum)
Patient verbalized understanding of this note. Encouraged him to get the flu shot but he  Refuses.   ----- Message from Pollyann SamplesLacie K Burton, NP sent at 12/24/2017 11:13 AM EST ----- Please let him know CBC result. ANC is slightly decreased but no other abnormalities. No urgent concerns. Review infection precautions and encourage him to get flu vaccine if not already done so. Keep lab, f/u with Dr. Mosetta PuttFeng in 02/2018 and call with any new concerns.  Thanks, Clayborn HeronLacie

## 2018-02-27 ENCOUNTER — Ambulatory Visit
Admission: RE | Admit: 2018-02-27 | Discharge: 2018-02-27 | Disposition: A | Discharge status: Home / Self Care | Payer: Medicare Other | Source: Ambulatory Visit | Attending: Family Medicine | Admitting: Family Medicine

## 2018-02-27 ENCOUNTER — Other Ambulatory Visit: Payer: Self-pay | Admitting: Family Medicine

## 2018-02-27 DIAGNOSIS — M79672 Pain in left foot: Secondary | ICD-10-CM

## 2018-02-27 DIAGNOSIS — I10 Essential (primary) hypertension: Secondary | ICD-10-CM | POA: Diagnosis not present

## 2018-02-27 DIAGNOSIS — M130 Polyarthritis, unspecified: Secondary | ICD-10-CM | POA: Diagnosis not present

## 2018-02-27 DIAGNOSIS — H6123 Impacted cerumen, bilateral: Secondary | ICD-10-CM | POA: Diagnosis not present

## 2018-03-04 ENCOUNTER — Other Ambulatory Visit: Payer: Self-pay

## 2018-03-04 ENCOUNTER — Emergency Department (HOSPITAL_COMMUNITY)
Admission: EM | Admit: 2018-03-04 | Discharge: 2018-03-04 | Disposition: A | Discharge status: Home / Self Care | Payer: Medicare Other | Attending: Emergency Medicine | Admitting: Emergency Medicine

## 2018-03-04 ENCOUNTER — Encounter (HOSPITAL_COMMUNITY): Payer: Self-pay

## 2018-03-04 ENCOUNTER — Emergency Department (HOSPITAL_COMMUNITY): Payer: Medicare Other

## 2018-03-04 DIAGNOSIS — Z5321 Procedure and treatment not carried out due to patient leaving prior to being seen by health care provider: Secondary | ICD-10-CM | POA: Diagnosis not present

## 2018-03-04 DIAGNOSIS — M25511 Pain in right shoulder: Secondary | ICD-10-CM | POA: Diagnosis not present

## 2018-03-04 NOTE — ED Triage Notes (Signed)
Pt reports R shoulder pain that started yesterday morning. Decreased ROM in that shoulder and pain when raising that arm. No unilateral numbness, facial droop, or other neuro deficits noted. A&Ox4. Ambulatory.

## 2018-03-04 NOTE — ED Provider Notes (Cosign Needed)
While walking to patients room to evaluate him, I was informed by nursing staff that pt decided to leave without being seen. I did not establish care with this patient.    Karrie Meres, [place] 03/04/18 [phone]

## 2018-03-04 NOTE — ED Notes (Addendum)
Pt came to nurses station and slammed his blood pressure cuff and labels on the desk stating that he waited five hours in the waiting room to come back and hes been in the room for 15 minutes, that he will not wait any longer. This nurse stated that Cortni the PA had just signed up to see him and will be right in his room. Pt continued to talk over this nurse stating "what kind of emergency room is this" "this is the worse emergency room visit I have ever had." Pt would not stop to talk to this nurse. As patient left to the lobby, PA Cortni came to the room asking for the patient. This nurse explained the situation.

## 2018-03-04 NOTE — ED Notes (Signed)
Bed: WA24 Expected date:  Expected time:  Means of arrival:  Comments: 

## 2018-03-13 DIAGNOSIS — M130 Polyarthritis, unspecified: Secondary | ICD-10-CM | POA: Diagnosis not present

## 2018-03-13 DIAGNOSIS — I10 Essential (primary) hypertension: Secondary | ICD-10-CM | POA: Diagnosis not present

## 2018-03-18 NOTE — Progress Notes (Signed)
[place]   [phone] [phone]   Clinic Follow up Note   Patient Care Team: Lucianne Lei, MD as PCP - General (Family Medicine)  Date of Service:  03/20/2018  CHIEF COMPLAINT: F/u of Neutropenia    INTERVAL HISTORY:  Randy Melton is here for a follow up of neutropenia. He was last seen by our clinic 6 months ago with NP Lacie. He presents to the clinic today by himself. He is doing well. He has left wrist brace on.  He notes since last visit he did not have any infections and denies any fever. He notes he has gained weight, has adequate energy.  He notes he has been getting testosterone supplements and ensure health supplemental drops from [facility] of health store.     REVIEW OF SYSTEMS:   Constitutional: Denies fevers, chills or abnormal weight loss Eyes: Denies blurriness of vision Ears, nose, mouth, throat, and face: Denies mucositis or sore throat Respiratory: Denies cough, dyspnea or wheezes Cardiovascular: Denies palpitation, chest discomfort or lower extremity swelling Gastrointestinal:  Denies nausea, heartburn or change in bowel habits Skin: Denies abnormal skin rashes Lymphatics: Denies new lymphadenopathy or easy bruising Neurological:Denies numbness, tingling or new weaknesses Behavioral/Psych: Mood is stable, no new changes  All other systems were reviewed with the patient and are negative.  MEDICAL HISTORY:  Past Medical History:  Diagnosis Date  . Arthritis   . Hypertension     SURGICAL HISTORY: Past Surgical History:  Procedure Laterality Date  . COLON SURGERY    . HERNIA REPAIR    . KNEE SURGERY      I have reviewed the social history and family history with the patient and they are unchanged from previous note.  ALLERGIES:  has No Known Allergies.  MEDICATIONS:  Current Outpatient Medications  Medication Sig Dispense Refill  . cholecalciferol (VITAMIN D) 1000 units tablet Take 1,000 Units by mouth  daily.    Marland Kitchen etodolac (LODINE) 400 MG tablet Take 400 mg by mouth 2 (two) times daily.  2  . fish oil-omega-3 fatty acids 1000 MG capsule Take 2 g by mouth daily.      . Multiple Vitamin (MULTIVITAMIN) tablet Take 1 tablet by mouth daily.      . NON FORMULARY     . NON FORMULARY     . valsartan-hydrochlorothiazide (DIOVAN-HCT) 80-12.5 MG tablet TAKE 1 TABLET BY MOUTH EVERY DAY FOR BLOOD PRESSURE  1   No current facility-administered medications for this visit.     PHYSICAL EXAMINATION: ECOG PERFORMANCE STATUS: 0 - Asymptomatic  Vitals:   03/20/18 1154  BP: 124/90  Pulse: 72  Resp: 18  Temp: 98.1 F (36.7 C)  SpO2: 99%   Filed Weights   03/20/18 1154  Weight: 192 lb 3.2 oz (87.2 kg)    GENERAL:alert, no distress and comfortable SKIN: skin color, texture, turgor are normal, no rashes or significant lesions EYES: normal, Conjunctiva are pink and non-injected, sclera clear OROPHARYNX:no exudate, no erythema and lips, buccal mucosa, and tongue normal  NECK: supple, thyroid normal size, non-tender, without nodularity LYMPH:  no palpable lymphadenopathy in the cervical, axillary or inguinal LUNGS: clear to auscultation and percussion with normal breathing effort HEART: regular rate & rhythm and no murmurs and no lower extremity edema ABDOMEN:abdomen soft, non-tender and normal bowel sounds Musculoskeletal:no cyanosis of digits and no clubbing  NEURO: alert & oriented x 3 with fluent speech, no focal motor/sensory deficits  LABORATORY DATA:  I have reviewed the  data as listed CBC Latest Ref Rng & Units 03/20/2018 12/18/2017 09/17/2017  WBC 4.0 - 10.5 K/uL 4.0 4.1 3.9(L)  Hemoglobin 13.0 - 17.0 g/dL 12.9(L) 13.3 13.7  Hematocrit 39.0 - 52.0 % 38.3(L) 40.1 40.2  Platelets 150 - 400 K/uL 228 248 227     CMP Latest Ref Rng & Units 09/17/2017 05/18/2009 05/17/2009  Glucose 70 - 99 mg/dL 94 99 -  BUN 8 - 23 mg/dL 12 15 -  Creatinine 0.61 - 1.24 mg/dL 1.09 1.19 -  Sodium 135 - 145  mmol/L 140 135 -  Potassium 3.5 - 5.1 mmol/L 4.1 3.9 -  Chloride 98 - 111 mmol/L 105 107 -  CO2 22 - 32 mmol/L 27 23 -  Calcium 8.9 - 10.3 mg/dL 10.2 8.3(L) -  Total Protein 6.5 - 8.1 g/dL 8.7(H) - 6.6  Total Bilirubin 0.3 - 1.2 mg/dL 1.0 - 1.1  Alkaline Phos 38 - 126 U/L 82 - 51  AST 15 - 41 U/L 19 - 38(H)  ALT 0 - 44 U/L 18 - 23      RADIOGRAPHIC STUDIES: I have personally reviewed the radiological images as listed and agreed with the findings in the report. No results found.   ASSESSMENT & PLAN:  Randy Melton is a 63 y.o. male with    1. Neutropenia  -He had isolated anemia in 2011 in the setting of hospitalization and hernia repair. Summer 2019  CBC showed neutropenia without associated other hematologic abnormalities.  -His neutropenia has been mild and intermittent overall.  -No liver or spleen abnormalities seen on CT in 07/2017, no need to repeat imaging.  -I recommended to hold bone marrow biopsy for now, but will reconsider if neutropenia worsens.  -Labs reveiwed today, WBC borderline low at 4. ANC at 1.6, Hg at 12.9. His neutropenia has improved, near resolved now. He has not had any infections lately. I dicussed this borderline low levels could also be related to his ethnicity of African-American.  -I do not think he needs f/u with [place] routinely.  I recommend him to have CBC 1-2 times a year was PCP, f/u with me if levels worsen or he develops other cytopenia -I encouraged him to maintain healthy diet and exercise    PLAN: -F/u as needed, CBC 1-2 times a year with PCP     No problem-specific Assessment & Plan notes found for this encounter.   No orders of the defined types were placed in this encounter.  All questions were answered. The patient knows to call the clinic with any problems, questions or concerns. No barriers to learning was detected. I spent 10 minutes counseling the patient face to face. The total time spent in the appointment was 15 minutes  and more than 50% was on counseling and review of test results     Truitt Merle, MD 03/20/2018   I, Joslyn Devon, am acting as scribe for Truitt Merle, MD.   I have reviewed the above documentation for accuracy and completeness, and I agree with the above.

## 2018-03-20 ENCOUNTER — Inpatient Hospital Stay: Payer: Medicare Other | Attending: Hematology

## 2018-03-20 ENCOUNTER — Telehealth: Payer: Self-pay | Admitting: Hematology

## 2018-03-20 ENCOUNTER — Inpatient Hospital Stay (HOSPITAL_BASED_OUTPATIENT_CLINIC_OR_DEPARTMENT_OTHER): Payer: Medicare Other | Admitting: Hematology

## 2018-03-20 ENCOUNTER — Encounter: Payer: Self-pay | Admitting: Hematology

## 2018-03-20 DIAGNOSIS — D649 Anemia, unspecified: Secondary | ICD-10-CM

## 2018-03-20 DIAGNOSIS — D709 Neutropenia, unspecified: Secondary | ICD-10-CM | POA: Insufficient documentation

## 2018-03-20 DIAGNOSIS — I10 Essential (primary) hypertension: Secondary | ICD-10-CM

## 2018-03-20 DIAGNOSIS — Z79899 Other long term (current) drug therapy: Secondary | ICD-10-CM | POA: Insufficient documentation

## 2018-03-20 DIAGNOSIS — D708 Other neutropenia: Secondary | ICD-10-CM

## 2018-03-20 LAB — CBC WITH DIFFERENTIAL (CANCER CENTER ONLY)
ABS IMMATURE GRANULOCYTES: 0.01 10*3/uL (ref 0.00–0.07)
Basophils Absolute: 0 10*3/uL (ref 0.0–0.1)
Basophils Relative: 1 %
Eosinophils Absolute: 0.1 10*3/uL (ref 0.0–0.5)
Eosinophils Relative: 3 %
HCT: 38.3 % — ABNORMAL LOW (ref 39.0–52.0)
Hemoglobin: 12.9 g/dL — ABNORMAL LOW (ref 13.0–17.0)
IMMATURE GRANULOCYTES: 0 %
Lymphocytes Relative: 41 %
Lymphs Abs: 1.7 10*3/uL (ref 0.7–4.0)
MCH: 30.6 pg (ref 26.0–34.0)
MCHC: 33.7 g/dL (ref 30.0–36.0)
MCV: 90.8 fL (ref 80.0–100.0)
MONOS PCT: 14 %
Monocytes Absolute: 0.6 10*3/uL (ref 0.1–1.0)
Neutro Abs: 1.6 10*3/uL — ABNORMAL LOW (ref 1.7–7.7)
Neutrophils Relative %: 41 %
Platelet Count: 228 10*3/uL (ref 150–400)
RBC: 4.22 MIL/uL (ref 4.22–5.81)
RDW: 12.6 % (ref 11.5–15.5)
WBC Count: 4 10*3/uL (ref 4.0–10.5)
nRBC: 0 % (ref 0.0–0.2)

## 2018-03-20 NOTE — Telephone Encounter (Signed)
Per 2/26 los, F/u as needed in future.

## 2018-03-27 DIAGNOSIS — Z0000 Encounter for general adult medical examination without abnormal findings: Secondary | ICD-10-CM | POA: Diagnosis not present

## 2018-07-25 DIAGNOSIS — I10 Essential (primary) hypertension: Secondary | ICD-10-CM | POA: Diagnosis not present

## 2018-07-25 DIAGNOSIS — M130 Polyarthritis, unspecified: Secondary | ICD-10-CM | POA: Diagnosis not present

## 2018-09-03 DIAGNOSIS — Z1211 Encounter for screening for malignant neoplasm of colon: Secondary | ICD-10-CM | POA: Diagnosis not present

## 2018-09-03 DIAGNOSIS — Z1212 Encounter for screening for malignant neoplasm of rectum: Secondary | ICD-10-CM | POA: Diagnosis not present

## 2018-09-05 DIAGNOSIS — H25813 Combined forms of age-related cataract, bilateral: Secondary | ICD-10-CM | POA: Diagnosis not present

## 2018-09-08 ENCOUNTER — Encounter (HOSPITAL_COMMUNITY): Payer: Self-pay

## 2018-09-08 ENCOUNTER — Emergency Department (HOSPITAL_COMMUNITY)
Admission: EM | Admit: 2018-09-08 | Discharge: 2018-09-09 | Disposition: A | Discharge status: Home / Self Care | Payer: Medicare Other | Attending: Emergency Medicine | Admitting: Emergency Medicine

## 2018-09-08 ENCOUNTER — Other Ambulatory Visit: Payer: Self-pay

## 2018-09-08 DIAGNOSIS — I10 Essential (primary) hypertension: Secondary | ICD-10-CM | POA: Diagnosis not present

## 2018-09-08 DIAGNOSIS — M5432 Sciatica, left side: Secondary | ICD-10-CM | POA: Diagnosis not present

## 2018-09-08 DIAGNOSIS — Z87891 Personal history of nicotine dependence: Secondary | ICD-10-CM | POA: Insufficient documentation

## 2018-09-08 DIAGNOSIS — M5442 Lumbago with sciatica, left side: Secondary | ICD-10-CM | POA: Diagnosis not present

## 2018-09-08 DIAGNOSIS — R1032 Left lower quadrant pain: Secondary | ICD-10-CM | POA: Diagnosis present

## 2018-09-08 LAB — URINALYSIS, ROUTINE W REFLEX MICROSCOPIC
Glucose, UA: NEGATIVE mg/dL
Hgb urine dipstick: NEGATIVE
Ketones, ur: NEGATIVE mg/dL
Leukocytes,Ua: NEGATIVE
Nitrite: NEGATIVE
Protein, ur: NEGATIVE mg/dL
Specific Gravity, Urine: 1.024 (ref 1.005–1.030)
pH: 6 (ref 5.0–8.0)

## 2018-09-08 MED ORDER — DIAZEPAM 2 MG PO TABS
2.0000 mg | ORAL_TABLET | Freq: Once | ORAL | Status: AC
  Administered 2018-09-08: 2 mg via ORAL
  Filled 2018-09-08: qty 1

## 2018-09-08 MED ORDER — METHYLPREDNISOLONE SODIUM SUCC 125 MG IJ SOLR
125.0000 mg | Freq: Once | INTRAMUSCULAR | Status: AC
  Administered 2018-09-08: 125 mg via INTRAMUSCULAR
  Filled 2018-09-08: qty 2

## 2018-09-08 MED ORDER — OXYCODONE-ACETAMINOPHEN 5-325 MG PO TABS
1.0000 | ORAL_TABLET | Freq: Once | ORAL | Status: AC
  Administered 2018-09-08: 1 via ORAL
  Filled 2018-09-08: qty 1

## 2018-09-08 MED ORDER — KETOROLAC TROMETHAMINE 60 MG/2ML IM SOLN
30.0000 mg | Freq: Once | INTRAMUSCULAR | Status: AC
  Administered 2018-09-08: 30 mg via INTRAMUSCULAR
  Filled 2018-09-08: qty 2

## 2018-09-08 NOTE — ED Provider Notes (Signed)
Emergency Department Provider Note   I have reviewed the triage vital signs and the nursing notes.   HISTORY  Chief Complaint Flank Pain   HPI Randy Melton is a 63 y.o. male without significant past medical history who presents the emergency department today with atraumatic left lower back pain that radiates around the side down his left leg terminating just below the knee.  Patient states he has had episodes with this before but nothing is persistent or severe as this is.  Started earlier today and is progressively worsened since that time.  No weakness, numbness or tingling in that leg.  Is able to walk but the pain just progressively worsens no matter what he does.  No urinary symptoms or GI symptoms.  No trauma.  No rashes.   No other associated or modifying symptoms.    Past Medical History:  Diagnosis Date  . Arthritis   . Hypertension     Patient Active Problem List   Diagnosis Date Noted  . Neutropenia ([place]) 03/20/2018    Past Surgical History:  Procedure Laterality Date  . COLON SURGERY    . HERNIA REPAIR    . KNEE SURGERY      Current Outpatient Rx  . Order #: [id number] Class: Historical Med  . Order #: [id number] Class: Normal  . Order #: [id number] Class: Historical Med  . Order #: [id number] Class: Historical Med  . Order #: [id number] Class: Historical Med  . Order #: [id number] Class: Historical Med  . Order #: [id number] Class: Historical Med  . Order #: [id number] Class: Normal  . Order #: [id number] Class: Historical Med    Allergies Patient has no known allergies.  Family History  Problem Relation Age of Onset  . Cancer Cousin        colon     Social History Social History   Tobacco Use  . Smoking status: Former Smoker    Types: Cigarettes    Quit date: 1975    Years since quitting: 45.6  . Smokeless tobacco: Never Used  Substance Use Topics  . Alcohol use: Yes    Comment: 1 beer per day, on weekends spirits and wine   . Drug use: Yes   Types: Cocaine    Comment: cocaine 09/14/17 and marjuana 09/16/17; intermittent since age 56    Review of Systems  All other systems negative except as documented in the HPI. All pertinent positives and negatives as reviewed in the HPI. ____________________________________________   PHYSICAL EXAM:  VITAL SIGNS: ED Triage Vitals  Enc Vitals Group     BP 09/08/18 2111 (!) 149/102     Pulse Rate 09/08/18 2111 (!) 103     Resp 09/08/18 2111 16     Temp 09/08/18 2111 98 F (36.7 C)     Temp Source 09/08/18 2111 Oral     SpO2 09/08/18 2111 99 %     Weight 09/08/18 2112 180 lb (81.6 kg)     Height 09/08/18 2112 5\' 9"  (1.753 m)    Constitutional: Alert and oriented. Well appearing and in no acute distress. Eyes: Conjunctivae are normal. PERRL. EOMI. Head: Atraumatic. Nose: No congestion/rhinnorhea. Mouth/Throat: Mucous membranes are moist.  Oropharynx non-erythematous. Neck: No stridor.  No meningeal signs.   Cardiovascular: Normal rate, regular rhythm. Good peripheral circulation. Grossly normal heart sounds.   Respiratory: Normal respiratory effort.  No retractions. Lungs CTAB. Gastrointestinal: Soft and nontender. No distention.  Musculoskeletal: No lower extremity tenderness nor edema. No gross deformities of extremities. ttp over left lower pelvis Neurologic:  Normal speech and language. No gross focal neurologic deficits are appreciated.  Skin:  Skin is warm, dry and intact. No rash noted.   ____________________________________________   LABS (all labs ordered are listed, but only abnormal results are displayed)  Labs Reviewed  URINALYSIS, ROUTINE W REFLEX MICROSCOPIC - Abnormal; Notable for the following components:      Result Value   Color, Urine AMBER (*)    APPearance HAZY (*)    Bilirubin Urine SMALL (*)    All other components within normal limits  URINE CULTURE   ____________________________________________   INITIAL IMPRESSION / ASSESSMENT AND PLAN / ED  COURSE  Seems c/w sciatica with component of muscular spasm. Plan for symptomatic control at this time.   Almost complete resolution of pain with meds provided. Will rx same. pcp followup   Pertinent labs & imaging results that were available during my care of the patient were reviewed by me and considered in my medical decision making (see chart for details).  A medical screening exam was performed and I feel the patient has had an appropriate workup for their chief complaint at this time and likelihood of emergent condition existing is low. They have been counseled on decision, discharge, follow up and which symptoms necessitate immediate return to the emergency department. They or their family verbally stated understanding and agreement with plan and discharged in stable condition.   ____________________________________________  FINAL CLINICAL IMPRESSION(S) / ED DIAGNOSES  Final diagnoses:  Sciatica of left side     MEDICATIONS GIVEN DURING THIS VISIT:  Medications  oxyCODONE-acetaminophen (PERCOCET/ROXICET) 5-325 MG per tablet 1 tablet (1 tablet Oral Given 09/08/18 2120)  methylPREDNISolone sodium succinate (SOLU-MEDROL) 125 mg/2 mL injection 125 mg (125 mg Intramuscular Given 09/08/18 2352)  ketorolac (TORADOL) injection 30 mg (30 mg Intramuscular Given 09/08/18 2352)  diazepam (VALIUM) tablet 2 mg (2 mg Oral Given 09/08/18 2352)     NEW OUTPATIENT MEDICATIONS STARTED DURING THIS VISIT:  Discharge Medication List as of 09/09/2018  1:02 AM    START taking these medications   Details  diazepam (VALIUM) 5 MG tablet Take 0.5 tablets (2.5 mg total) by mouth every 6 (six) hours as needed for anxiety (spasms)., Starting Mon 09/09/2018, Normal    predniSONE (DELTASONE) 20 MG tablet 3 tabs po daily x 3 days, then 2 tabs x 3 days, then 1.5 tabs x 3 days, then 1 tab x 3 days, then 0.5 tabs x 3 days, Normal        Note:  This note was prepared with assistance of Dragon voice recognition  software. Occasional wrong-word or sound-a-like substitutions may have occurred due to the inherent limitations of voice recognition software.   Erza Mothershead, Barbara CowerJason, MD 09/09/18 [phone]

## 2018-09-08 NOTE — ED Triage Notes (Signed)
Pt states that he has been having L sided flank pain all day, denies n/v, denies dysuria

## 2018-09-09 MED ORDER — PREDNISONE 20 MG PO TABS: ORAL_TABLET | ORAL | 0 refills | Status: DC

## 2018-09-09 MED ORDER — DIAZEPAM 5 MG PO TABS: 2.5000 mg | ORAL_TABLET | Freq: Four times a day (QID) | ORAL | 0 refills | Status: DC | PRN

## 2018-09-10 DIAGNOSIS — M130 Polyarthritis, unspecified: Secondary | ICD-10-CM | POA: Diagnosis not present

## 2018-09-10 DIAGNOSIS — I10 Essential (primary) hypertension: Secondary | ICD-10-CM | POA: Diagnosis not present

## 2018-09-10 DIAGNOSIS — M5432 Sciatica, left side: Secondary | ICD-10-CM | POA: Diagnosis not present

## 2018-09-10 LAB — URINE CULTURE: Culture: NO GROWTH

## 2018-09-25 DIAGNOSIS — K5700 Diverticulitis of small intestine with perforation and abscess without bleeding: Secondary | ICD-10-CM | POA: Diagnosis not present

## 2018-09-25 DIAGNOSIS — M5432 Sciatica, left side: Secondary | ICD-10-CM | POA: Diagnosis not present

## 2018-09-25 DIAGNOSIS — I10 Essential (primary) hypertension: Secondary | ICD-10-CM | POA: Diagnosis not present

## 2018-11-26 DIAGNOSIS — I10 Essential (primary) hypertension: Secondary | ICD-10-CM | POA: Diagnosis not present

## 2018-11-26 DIAGNOSIS — M130 Polyarthritis, unspecified: Secondary | ICD-10-CM | POA: Diagnosis not present

## 2019-04-01 DIAGNOSIS — Z0000 Encounter for general adult medical examination without abnormal findings: Secondary | ICD-10-CM | POA: Diagnosis not present

## 2019-05-01 DIAGNOSIS — Z6828 Body mass index (BMI) 28.0-28.9, adult: Secondary | ICD-10-CM | POA: Diagnosis not present

## 2019-05-01 DIAGNOSIS — I10 Essential (primary) hypertension: Secondary | ICD-10-CM | POA: Diagnosis not present

## 2019-05-02 ENCOUNTER — Ambulatory Visit: Payer: Medicare Other | Attending: Internal Medicine

## 2019-05-02 DIAGNOSIS — Z23 Encounter for immunization: Secondary | ICD-10-CM

## 2019-05-02 NOTE — Progress Notes (Signed)
   Covid-19 Vaccination Clinic  Name:  MILUS FRITZE    MRN: [id number] DOB: January 19, 1956  05/02/2019  Mr. Strauss was observed post Covid-19 immunization for 15 minutes without incident. He was provided with Vaccine Information Sheet and instruction to access the V-Safe system.   Mr. Mcniel was instructed to call 911 with any severe reactions post vaccine: Marland Kitchen Difficulty breathing  . Swelling of face and throat  . A fast heartbeat  . A bad rash all over body  . Dizziness and weakness   Immunizations Administered    Name Date Dose VIS Date Route   Pfizer COVID-19 Vaccine 05/02/2019 12:22 PM 0.3 mL 01/03/2019 Intramuscular   Manufacturer: [employer], [employer]   Lot: [id number]   NDC: [address]-8

## 2019-05-28 ENCOUNTER — Ambulatory Visit: Payer: Medicare Other | Attending: Internal Medicine

## 2019-05-28 DIAGNOSIS — Z23 Encounter for immunization: Secondary | ICD-10-CM

## 2019-05-28 NOTE — Progress Notes (Signed)
   Covid-19 Vaccination Clinic  Name:  Randy Melton    MRN: [id number] DOB: 19-May-1955  05/28/2019  [name] was observed post Covid-19 immunization for 15 minutes without incident. He was provided with Vaccine Information Sheet and instruction to access the V-Safe system.   [name] was instructed to call 911 with any severe reactions post vaccine: Marland Kitchen Difficulty breathing  . Swelling of face and throat  . A fast heartbeat  . A bad rash all over body  . Dizziness and weakness   Immunizations Administered    Name Date Dose VIS Date Route   Pfizer COVID-19 Vaccine 05/28/2019 10:04 AM 0.3 mL 03/19/2018 Intramuscular   Manufacturer: [employer], [employer]   Lot: [id number]   NDC: [address]-9

## 2019-09-11 DIAGNOSIS — H25813 Combined forms of age-related cataract, bilateral: Secondary | ICD-10-CM | POA: Diagnosis not present

## 2019-09-23 IMAGING — CT CT ABD-PELV W/ CM
1 of 3 series · 13 of 32 positions shown, 19 images · IV contrast (APPLIED)
Comparison: CT scan of May 17, 2009.

CLINICAL DATA: Left lower quadrant abdominal pain.

EXAM:
CT ABDOMEN AND PELVIS WITH CONTRAST
TECHNIQUE: Multidetector CT imaging of the abdomen and pelvis was performed
using the standard protocol following bolus administration of
intravenous contrast.
Creatinine was obtained on site at [HOSPITAL] at [HOSPITAL].
Results: Creatinine 1.0 mg/dL.
CONTRAST:  100mL 99MF89-WXX IOPAMIDOL (99MF89-WXX) INJECTION 61%

[Series 2: abd/pelvis w/cm · axial · 0.65mm/px · z∈[-442,-66]mm · 13 of 87 slices shown, 19 images]
[im 6/87  soft-tissue]
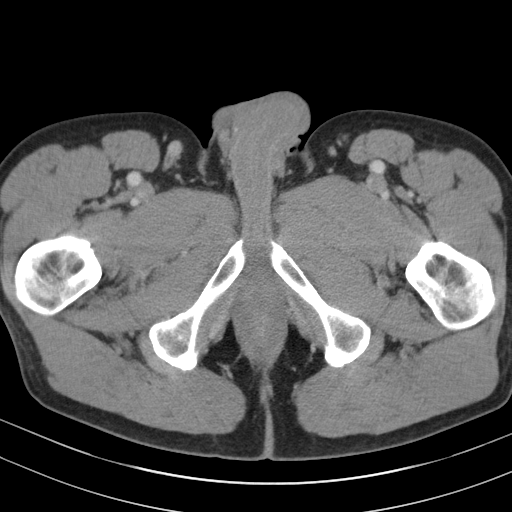
[im 6/87  bone]
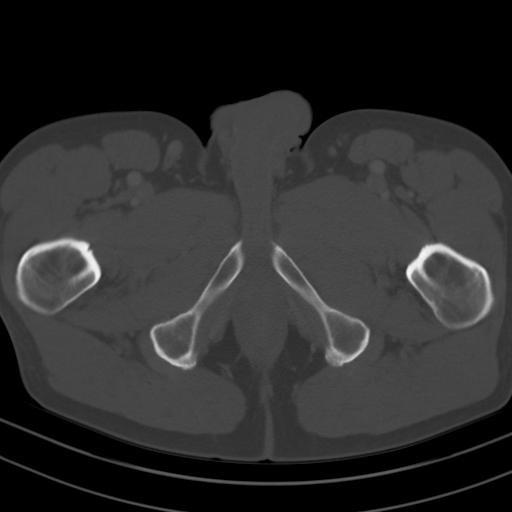
[im 12/87  soft-tissue]
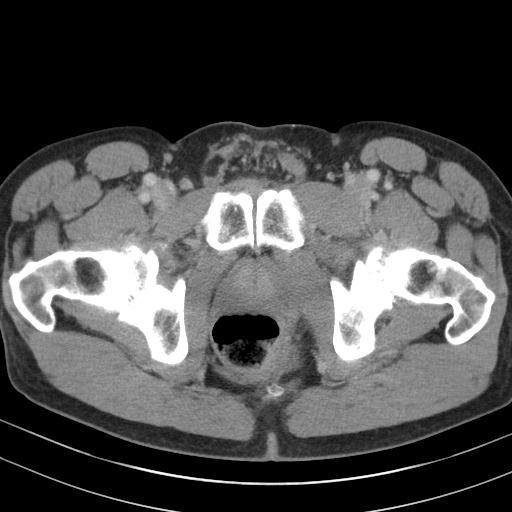
[im 18/87  soft-tissue]
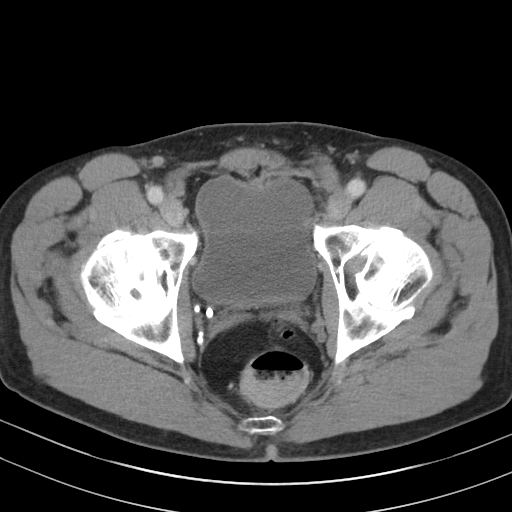
[im 23/87  soft-tissue]
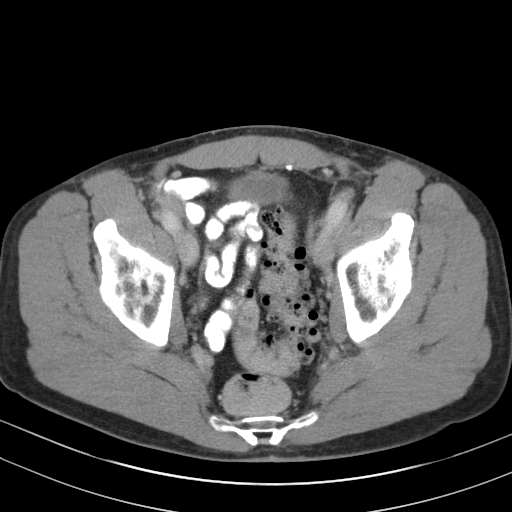
[im 29/87  soft-tissue]
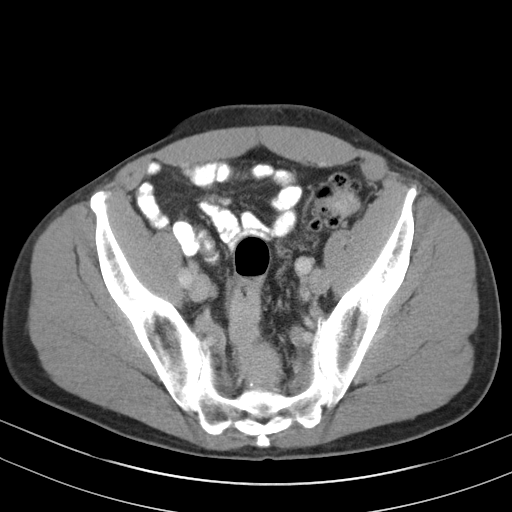
[im 35/87  soft-tissue]
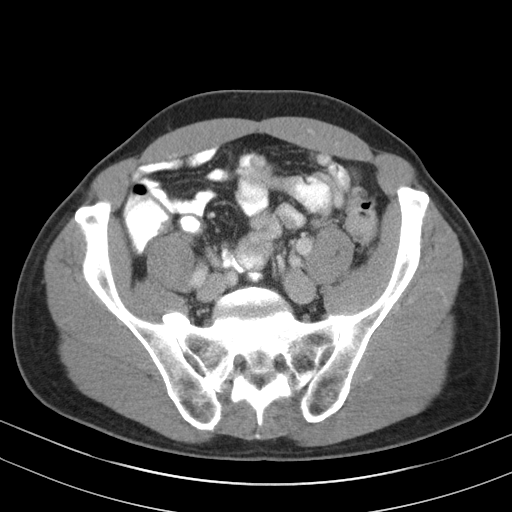
[im 46/87  soft-tissue]
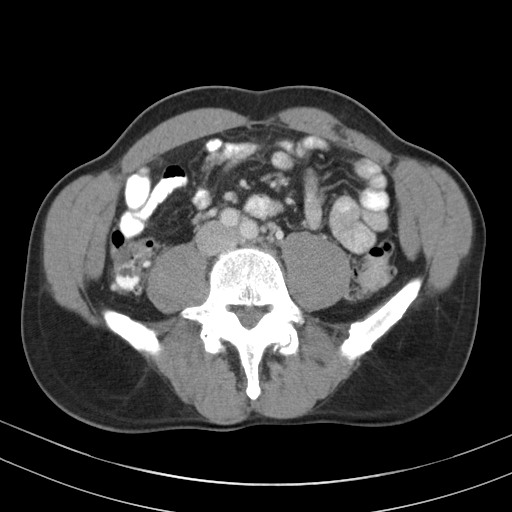
[im 52/87  soft-tissue]
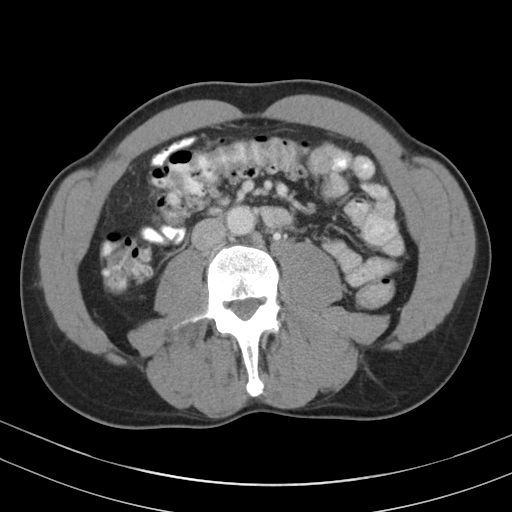
[im 58/87  soft-tissue]
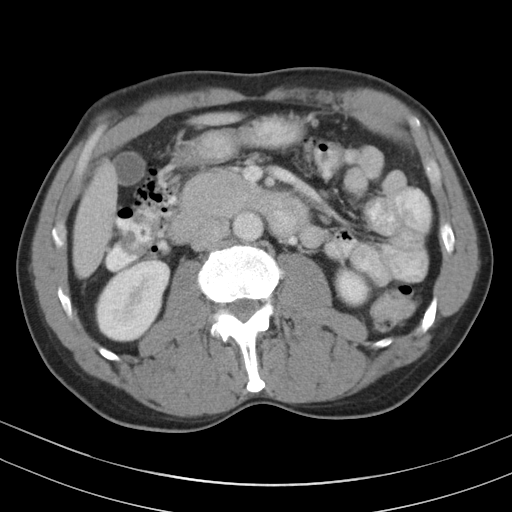
[im 58/87  bone]
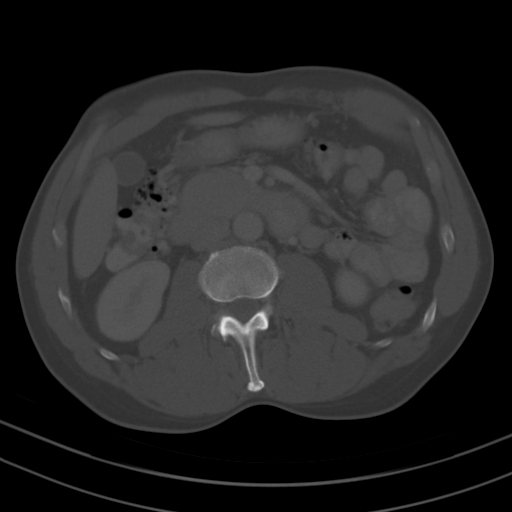
[im 64/87  soft-tissue]
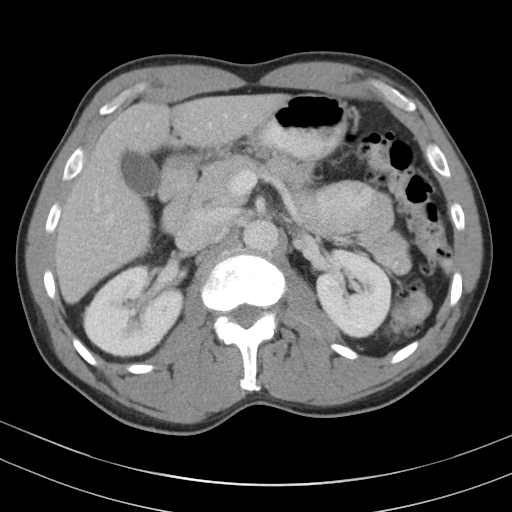
[im 64/87  lung]
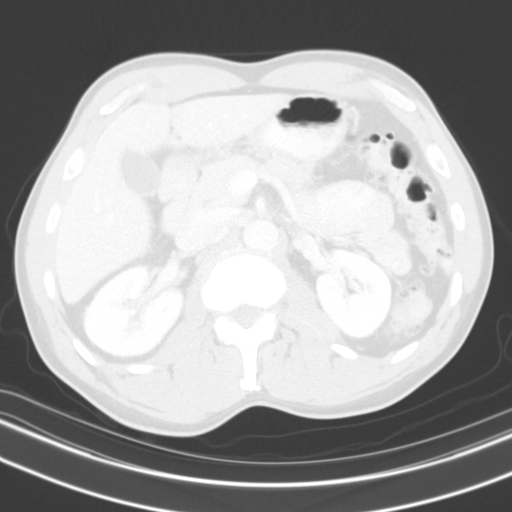
[im 69/87  soft-tissue]
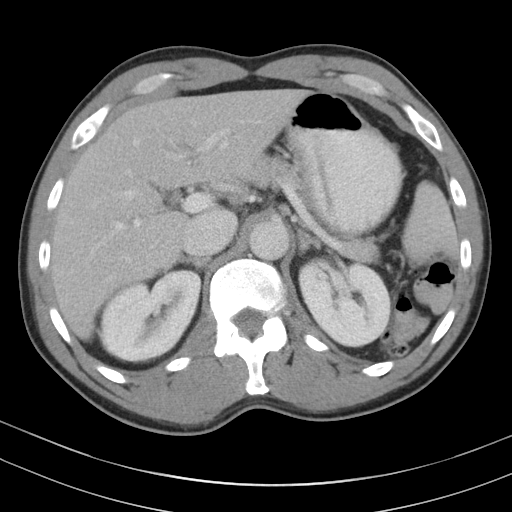
[im 69/87  lung]
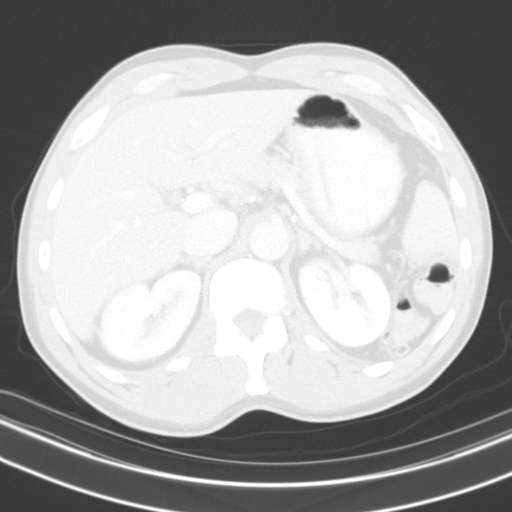
[im 75/87  soft-tissue]
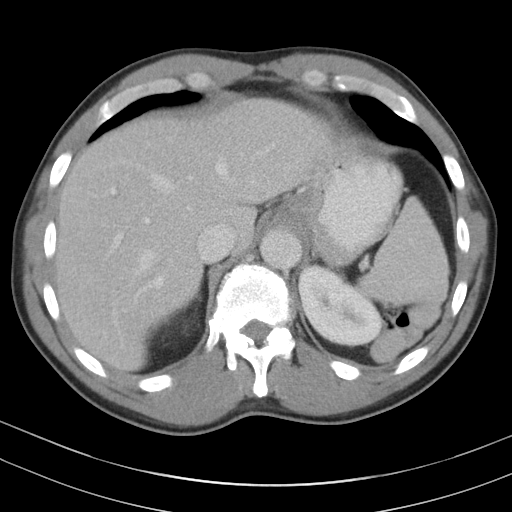
[im 75/87  lung]
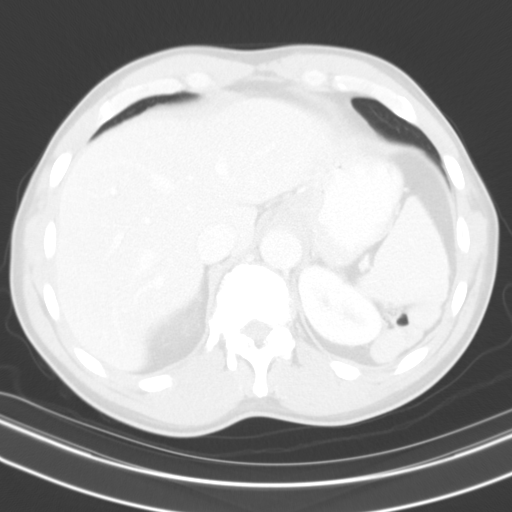
[im 81/87  soft-tissue]
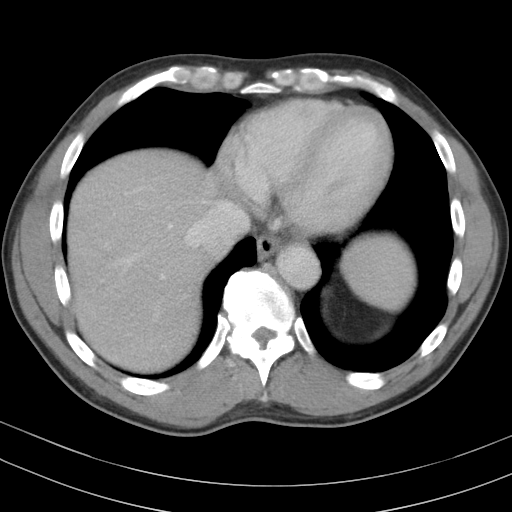
[im 81/87  lung]
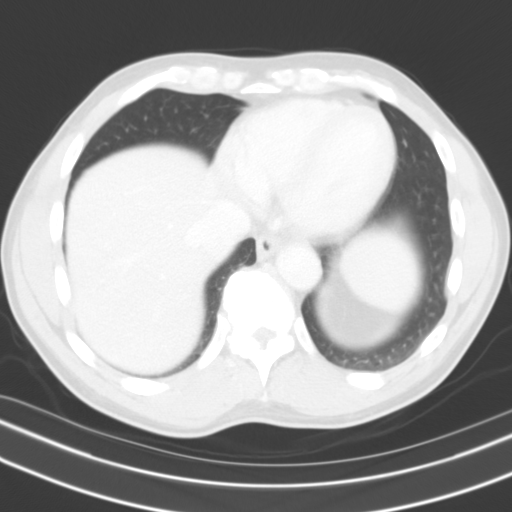

[13 of 32 positions shown; findings below may reference images not displayed]

FINDINGS: Lower chest: No acute abnormality.

Hepatobiliary: No focal liver abnormality is seen. No gallstones,
gallbladder wall thickening, or biliary dilatation.

Pancreas: Unremarkable. No pancreatic ductal dilatation or
surrounding inflammatory changes.

Spleen: Normal in size without focal abnormality.

Adrenals/Urinary Tract: Adrenal glands are unremarkable. Kidneys are
normal, without renal calculi, focal lesion, or hydronephrosis.
Bladder is unremarkable.

Stomach/Bowel: Stomach is within normal limits. Appendix appears
normal. No evidence of bowel wall thickening, distention, or
inflammatory changes. Diverticulosis of transverse, descending and
sigmoid colon is noted without inflammation.

Vascular/Lymphatic: No significant vascular findings are present. No
enlarged abdominal or pelvic lymph nodes.

Reproductive: Prostate is unremarkable.

Other: Status post ventral hernia repair. No abnormal fluid
collection is noted.

Musculoskeletal: No acute or significant osseous findings.
IMPRESSION: Extensive colonic diverticulosis is noted without inflammation.

No acute abnormality seen in the abdomen or pelvis.

## 2019-10-06 ENCOUNTER — Emergency Department (HOSPITAL_COMMUNITY)
Admission: EM | Admit: 2019-10-06 | Discharge: 2019-10-07 | Discharge status: Against Medical Advice | Payer: Medicare Other

## 2019-10-06 ENCOUNTER — Other Ambulatory Visit: Payer: Self-pay

## 2019-10-06 NOTE — ED Notes (Signed)
Pt states "I have an appointment with my doctor in the morning and I have to go pick somebody up". Pt advised to return if symptoms worsen.

## 2019-12-04 DIAGNOSIS — Z125 Encounter for screening for malignant neoplasm of prostate: Secondary | ICD-10-CM | POA: Diagnosis not present

## 2019-12-04 DIAGNOSIS — M545 Low back pain, unspecified: Secondary | ICD-10-CM | POA: Diagnosis not present

## 2019-12-04 DIAGNOSIS — I10 Essential (primary) hypertension: Secondary | ICD-10-CM | POA: Diagnosis not present

## 2019-12-26 ENCOUNTER — Ambulatory Visit: Payer: Medicare Other | Attending: Internal Medicine

## 2019-12-26 DIAGNOSIS — Z23 Encounter for immunization: Secondary | ICD-10-CM

## 2019-12-26 NOTE — Progress Notes (Signed)
   Covid-19 Vaccination Clinic  Name:  ENGELBERT SEVIN    MRN: [id number] DOB: 04-30-1955  12/26/2019  Mr. Albo was observed post Covid-19 immunization for 15 minutes without incident. He was provided with Vaccine Information Sheet and instruction to access the V-Safe system.   Mr. Vandenbrink was instructed to call 911 with any severe reactions post vaccine: Marland Kitchen Difficulty breathing  . Swelling of face and throat  . A fast heartbeat  . A bad rash all over body  . Dizziness and weakness   Immunizations Administered    Name Date Dose VIS Date Route   Pfizer COVID-19 Vaccine 12/26/2019  3:27 PM 0.3 mL 11/12/2019 Intramuscular   Manufacturer: [employer], [employer]   Lot: [id number]   NDC: [address]-0

## 2020-04-01 DIAGNOSIS — I10 Essential (primary) hypertension: Secondary | ICD-10-CM | POA: Diagnosis not present

## 2020-04-01 DIAGNOSIS — M130 Polyarthritis, unspecified: Secondary | ICD-10-CM | POA: Diagnosis not present

## 2020-04-20 IMAGING — DX DG FOOT COMPLETE 3+V*L*
3 series · 3 of 3 positions shown · non-contrast
Comparison: Left foot x-rays dated November 01, 2006.

CLINICAL DATA: Left foot pain, numbness, and tingling.

EXAM:
LEFT FOOT - COMPLETE 3+ VIEW

[dg foot complete left (1 of 3)]
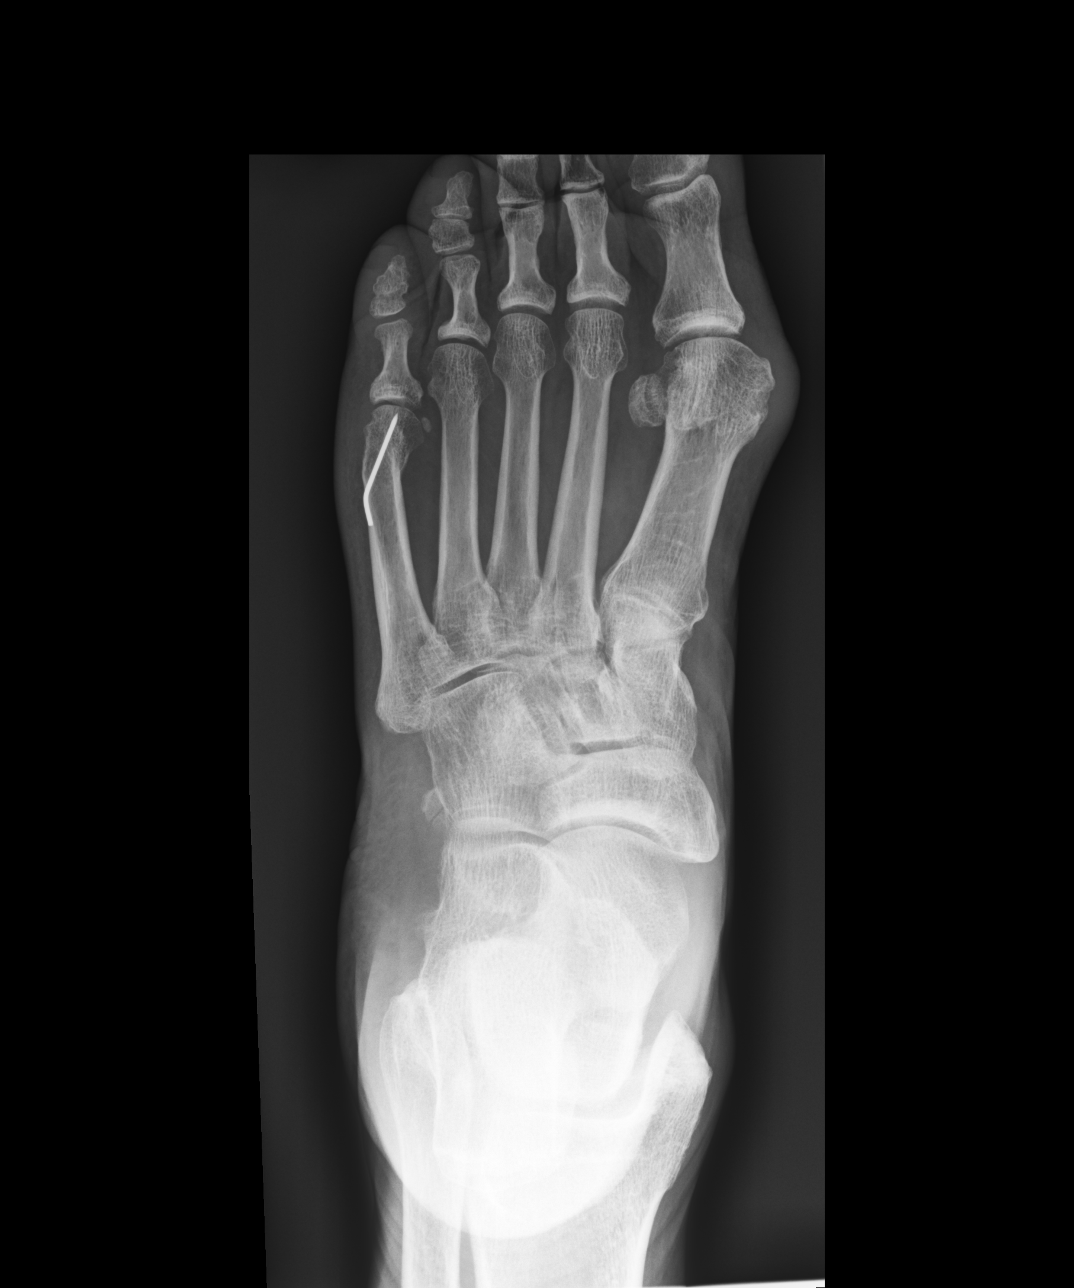

[dg foot complete left (2 of 3)]
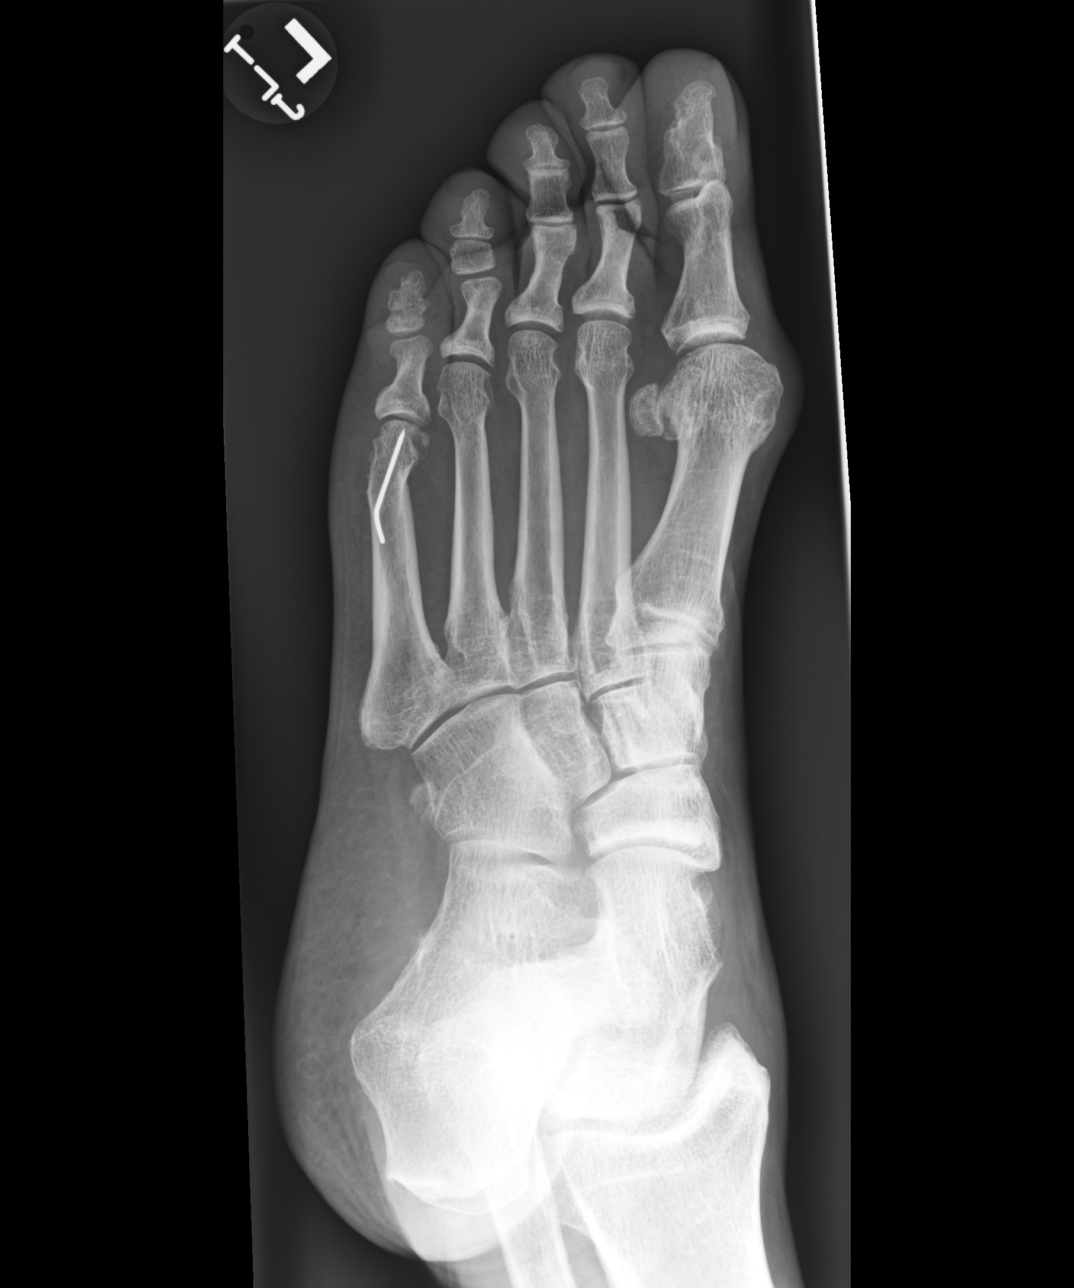

[dg foot complete left (3 of 3)]
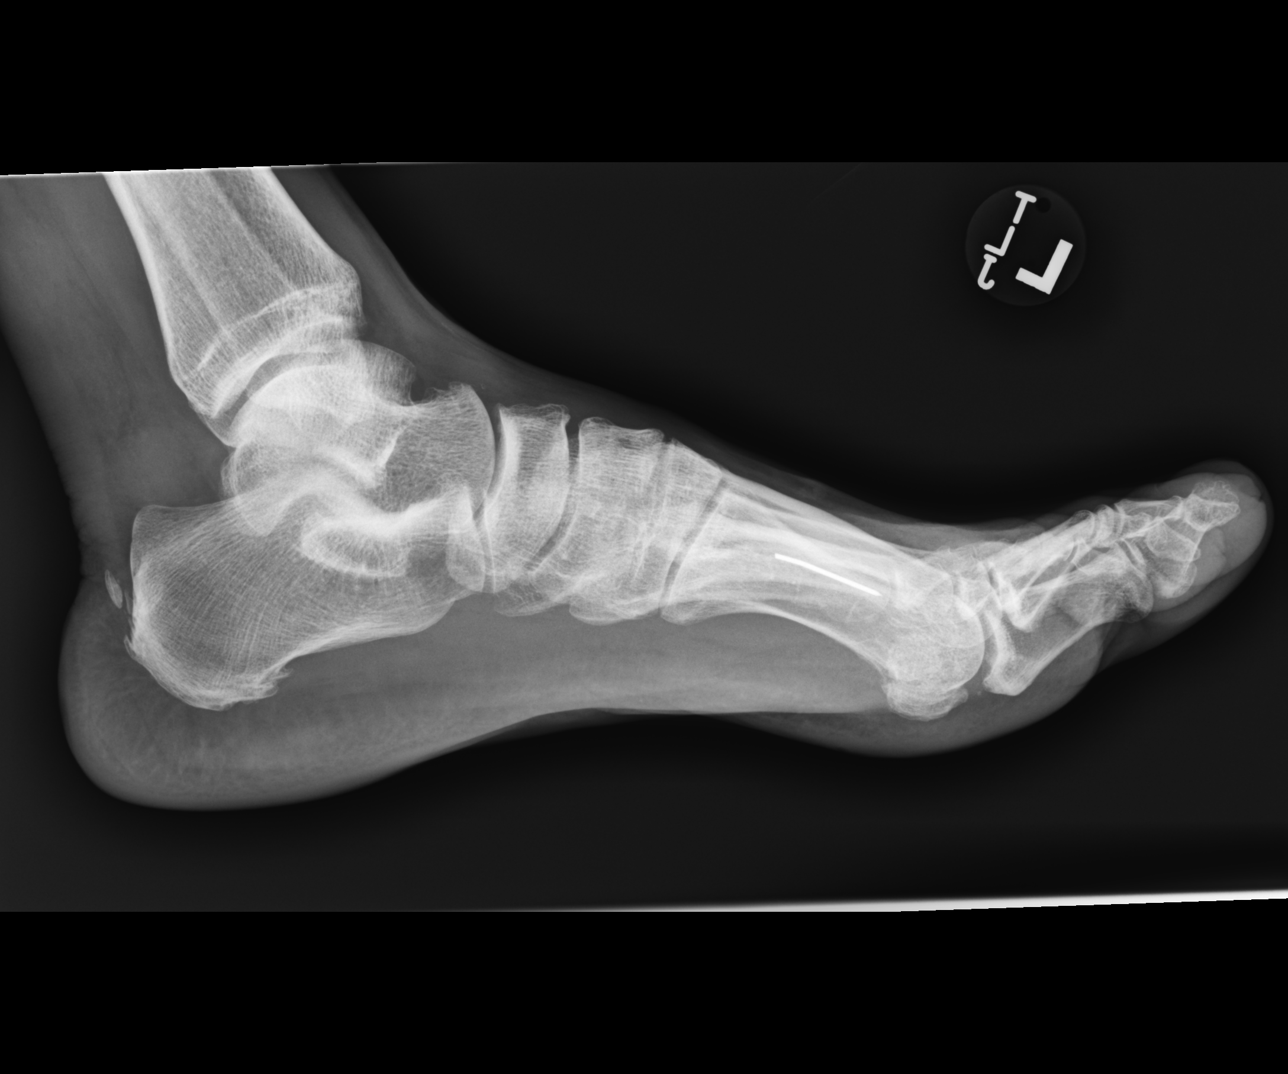

[3 of 3 positions shown; findings below may reference images not displayed]

FINDINGS: No acute fracture or dislocation. Healed, slightly angulated fifth
metatarsal neck fracture status post pinning. Unchanged mild hallux
valgus deformity. Joint spaces are relatively preserved. Bone
mineralization is normal. Plantar and Achilles enthesophytes. Soft
tissues are unremarkable.
IMPRESSION: 1. No acute osseous abnormality or significant degenerative changes.
2. Old healed fifth metatarsal neck fracture status post pinning.

## 2020-06-03 DIAGNOSIS — M1712 Unilateral primary osteoarthritis, left knee: Secondary | ICD-10-CM | POA: Diagnosis not present

## 2020-06-07 DIAGNOSIS — M25562 Pain in left knee: Secondary | ICD-10-CM | POA: Diagnosis not present

## 2020-07-24 DIAGNOSIS — Z20822 Contact with and (suspected) exposure to covid-19: Secondary | ICD-10-CM | POA: Diagnosis not present

## 2020-08-03 DIAGNOSIS — I10 Essential (primary) hypertension: Secondary | ICD-10-CM | POA: Diagnosis not present

## 2020-08-03 DIAGNOSIS — Z0000 Encounter for general adult medical examination without abnormal findings: Secondary | ICD-10-CM | POA: Diagnosis not present

## 2020-08-24 DIAGNOSIS — Z20822 Contact with and (suspected) exposure to covid-19: Secondary | ICD-10-CM | POA: Diagnosis not present

## 2020-09-14 DIAGNOSIS — H25813 Combined forms of age-related cataract, bilateral: Secondary | ICD-10-CM | POA: Diagnosis not present

## 2020-10-12 ENCOUNTER — Ambulatory Visit: Payer: Medicare Other | Attending: Internal Medicine

## 2020-10-12 DIAGNOSIS — Z23 Encounter for immunization: Secondary | ICD-10-CM

## 2020-10-12 NOTE — Progress Notes (Signed)
   Covid-19 Vaccination Clinic  Name:  LYNKIN SAINI    MRN: [id number] DOB: 03/14/1955  10/12/2020  Mr. Fratto was observed post Covid-19 immunization for 15 minutes without incident. He was provided with Vaccine Information Sheet and instruction to access the V-Safe system.   Mr. Zimmers was instructed to call 911 with any severe reactions post vaccine: Difficulty breathing  Swelling of face and throat  A fast heartbeat  A bad rash all over body  Dizziness and weakness

## 2020-10-19 ENCOUNTER — Other Ambulatory Visit (HOSPITAL_BASED_OUTPATIENT_CLINIC_OR_DEPARTMENT_OTHER): Payer: Self-pay

## 2020-10-19 DIAGNOSIS — Z23 Encounter for immunization: Secondary | ICD-10-CM | POA: Diagnosis not present

## 2020-10-19 MED ORDER — COVID-19MRNA BIVAL VACC PFIZER 30 MCG/0.3ML IM SUSP
INTRAMUSCULAR | 0 refills | Status: DC
  Filled 2020-10-19: qty 0.3, 1d supply, fill #0

## 2020-11-04 DIAGNOSIS — M21621 Bunionette of right foot: Secondary | ICD-10-CM | POA: Diagnosis not present

## 2020-11-04 DIAGNOSIS — Z125 Encounter for screening for malignant neoplasm of prostate: Secondary | ICD-10-CM | POA: Diagnosis not present

## 2020-11-04 DIAGNOSIS — I10 Essential (primary) hypertension: Secondary | ICD-10-CM | POA: Diagnosis not present

## 2020-11-04 DIAGNOSIS — M21622 Bunionette of left foot: Secondary | ICD-10-CM | POA: Diagnosis not present

## 2020-11-11 ENCOUNTER — Ambulatory Visit (INDEPENDENT_AMBULATORY_CARE_PROVIDER_SITE_OTHER): Payer: Medicare Other | Admitting: Podiatry

## 2020-11-11 ENCOUNTER — Ambulatory Visit (INDEPENDENT_AMBULATORY_CARE_PROVIDER_SITE_OTHER): Payer: Medicare Other

## 2020-11-11 ENCOUNTER — Encounter: Payer: Self-pay | Admitting: Podiatry

## 2020-11-11 ENCOUNTER — Other Ambulatory Visit: Payer: Self-pay

## 2020-11-11 DIAGNOSIS — M19071 Primary osteoarthritis, right ankle and foot: Secondary | ICD-10-CM

## 2020-11-11 DIAGNOSIS — M21612 Bunion of left foot: Secondary | ICD-10-CM

## 2020-11-11 DIAGNOSIS — M21611 Bunion of right foot: Secondary | ICD-10-CM

## 2020-11-11 DIAGNOSIS — M19072 Primary osteoarthritis, left ankle and foot: Secondary | ICD-10-CM

## 2020-11-14 ENCOUNTER — Encounter: Payer: Self-pay | Admitting: Podiatry

## 2020-11-14 NOTE — Progress Notes (Signed)
  Subjective:  Patient ID: Randy Melton, male    DOB: 1955/11/16,  MRN: [id number]  Chief Complaint  Patient presents with   Bunions      (np) Bil Bunion Pain    65 y.o. male presents with the above complaint. History confirmed with patient.  He had bunions for a long time and they continue to become more more painful, the rubbing on the second toe.  Objective:  Physical Exam: warm, good capillary refill, no trophic changes or ulcerative lesions, normal DP and PT pulses, and normal sensory exam.  Bilateral he has hallux valgus deformity with pain over the medial eminence, mild pain on the sesamoid complex and with range of motion   Radiographs: Multiple views x-ray of both feet: Moderate to severe hallux valgus deformity with degenerative changes in the first metatarsophalangeal joint bilateral Assessment:   1. Bilateral bunions   2. 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  Discussed the etiology and treatment including surgical and non surgical treatment for painful bunions. He has exhausted all non surgical treatment prior to this visit including shoe gear changes and padding. He desires surgical intervention. We discussed all risks including but not limited to: pain, swelling, infection, scar, numbness which may be temporary or permanent, chronic pain, stiffness, nerve pain or damage, wound healing problems, bone healing problems including delayed or non-union and recurrence. Specifically we discussed the following procedures: Arthrodesis of the first metatarsal phalangeal joint.  We also discussed the option of a Lapidus procedure, he does have some arthritic changes in the joint I discussed with him that I think Lapidus would be successful in correcting his deformity which could leave the possibility of osteoarthritis in the joint that could worsen over time and may need  addressing later down the road.  He prefer to have this addressed in a single stage procedure.. Informed consent was signed today. Surgery will be scheduled at a mutually agreeable date. Information regarding this will be forwarded to our surgery scheduler.   Surgical plan:  Procedure: -Arthrodesis metatarsophalangeal joint left foot with calcaneal autograft left foot  Location: -[facility]  Anesthesia plan: -Regional block (popliteal saphenous) with sedation  Postoperative pain plan: - Tylenol 1000 mg every 6 hours, ibuprofen 600 mg every 6 hours, gabapentin 300 mg every 8 hours x5 days, oxycodone 5 mg 1-2 tabs every 6 hours only as needed  DVT prophylaxis: -ASA 325mg  BID  WB Restrictions / DME needs: -NWB in CAM boot, will dispense at next visit  No follow-ups on file.

## 2021-01-06 ENCOUNTER — Encounter: Payer: Medicare Other | Admitting: Podiatry

## 2021-01-06 DIAGNOSIS — Z20822 Contact with and (suspected) exposure to covid-19: Secondary | ICD-10-CM | POA: Diagnosis not present

## 2021-01-20 ENCOUNTER — Encounter: Payer: Medicare Other | Admitting: Podiatry

## 2021-02-10 ENCOUNTER — Encounter: Payer: Medicare Other | Admitting: Podiatry

## 2021-02-15 ENCOUNTER — Telehealth: Payer: Self-pay

## 2021-02-15 NOTE — Telephone Encounter (Signed)
Nesbit called to cancel his surgery with Dr. Lilian Kapur on 03/18/2021. He stated he is moving and not sure where he will be. He will call me back if he wants to reschedule.

## 2021-02-24 DIAGNOSIS — H25813 Combined forms of age-related cataract, bilateral: Secondary | ICD-10-CM | POA: Diagnosis not present

## 2021-02-28 DIAGNOSIS — Z20822 Contact with and (suspected) exposure to covid-19: Secondary | ICD-10-CM | POA: Diagnosis not present

## 2021-03-08 DIAGNOSIS — I10 Essential (primary) hypertension: Secondary | ICD-10-CM | POA: Diagnosis not present

## 2021-03-08 DIAGNOSIS — Z6826 Body mass index (BMI) 26.0-26.9, adult: Secondary | ICD-10-CM | POA: Diagnosis not present

## 2021-03-08 DIAGNOSIS — M130 Polyarthritis, unspecified: Secondary | ICD-10-CM | POA: Diagnosis not present

## 2021-03-18 ENCOUNTER — Ambulatory Visit (HOSPITAL_BASED_OUTPATIENT_CLINIC_OR_DEPARTMENT_OTHER): Admission: RE | Admit: 2021-03-18 | Payer: Medicare Other | Source: Home / Self Care | Admitting: Podiatry

## 2021-03-18 ENCOUNTER — Encounter (HOSPITAL_BASED_OUTPATIENT_CLINIC_OR_DEPARTMENT_OTHER): Admission: RE | Payer: Self-pay | Source: Home / Self Care

## 2021-03-18 SURGERY — FUSION, JOINT, GREAT TOE
Anesthesia: Choice | Laterality: Left

## 2021-03-24 ENCOUNTER — Encounter: Payer: Medicare Other | Admitting: Podiatry

## 2021-04-07 ENCOUNTER — Encounter: Payer: Medicare Other | Admitting: Podiatry

## 2021-04-28 ENCOUNTER — Encounter: Payer: Medicare Other | Admitting: Podiatry

## 2021-05-09 DIAGNOSIS — Z20822 Contact with and (suspected) exposure to covid-19: Secondary | ICD-10-CM | POA: Diagnosis not present

## 2021-07-07 DIAGNOSIS — M130 Polyarthritis, unspecified: Secondary | ICD-10-CM | POA: Diagnosis not present

## 2021-07-07 DIAGNOSIS — M545 Low back pain, unspecified: Secondary | ICD-10-CM | POA: Diagnosis not present

## 2021-07-07 DIAGNOSIS — Z0000 Encounter for general adult medical examination without abnormal findings: Secondary | ICD-10-CM | POA: Diagnosis not present

## 2021-07-07 DIAGNOSIS — I10 Essential (primary) hypertension: Secondary | ICD-10-CM | POA: Diagnosis not present

## 2021-08-04 ENCOUNTER — Ambulatory Visit: Payer: Medicare Other | Admitting: Podiatry

## 2021-08-24 DIAGNOSIS — H52223 Regular astigmatism, bilateral: Secondary | ICD-10-CM | POA: Diagnosis not present

## 2021-08-24 DIAGNOSIS — H524 Presbyopia: Secondary | ICD-10-CM | POA: Diagnosis not present

## 2021-08-24 DIAGNOSIS — H25813 Combined forms of age-related cataract, bilateral: Secondary | ICD-10-CM | POA: Diagnosis not present

## 2021-09-07 DIAGNOSIS — H25812 Combined forms of age-related cataract, left eye: Secondary | ICD-10-CM | POA: Diagnosis not present

## 2021-09-13 ENCOUNTER — Ambulatory Visit: Payer: Medicare Other | Admitting: Podiatry

## 2021-09-13 DIAGNOSIS — M21612 Bunion of left foot: Secondary | ICD-10-CM

## 2021-09-13 DIAGNOSIS — M19072 Primary osteoarthritis, left ankle and foot: Secondary | ICD-10-CM

## 2021-09-13 DIAGNOSIS — M19071 Primary osteoarthritis, right ankle and foot: Secondary | ICD-10-CM

## 2021-09-13 DIAGNOSIS — M21611 Bunion of right foot: Secondary | ICD-10-CM

## 2021-09-13 NOTE — Progress Notes (Signed)
  Subjective:  Patient ID: Randy Melton, male    DOB: 25-Feb-1955,  MRN: [id number]  Chief Complaint  Patient presents with   Bunions    Left foot pain at bunion. Patient wants to discuss alternatives to surgery    66 y.o. male presents with the above complaint. History confirmed with patient.    Objective:  Physical Exam: warm, good capillary refill, no trophic changes or ulcerative lesions, normal DP and PT pulses, and normal sensory exam.  Bilateral he has hallux valgus deformity with pain over the medial eminence, mild pain on the sesamoid complex and with range of motion   Radiographs: Multiple views x-ray of both feet: Moderate to severe hallux valgus deformity with degenerative changes in the first metatarsophalangeal joint bilateral Assessment:   1. 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 Bilateral bunions       Plan:  Patient was evaluated and treated and all questions answered.  We again discussed etiology treatment options of bunion deformity in detail.  We discussed operative and nonoperative treatment.  I do think he eventually will need surgical correction with arthrodesis.  He would like to try nonoperative treatment.  I showed him a few types of bunion splints and/or shields to alleviate this.  He will try these and let me know if it is failed to schedule surgery  Return if ready to schedule surgery.

## 2021-11-15 DIAGNOSIS — I10 Essential (primary) hypertension: Secondary | ICD-10-CM | POA: Diagnosis not present

## 2021-11-15 DIAGNOSIS — E559 Vitamin D deficiency, unspecified: Secondary | ICD-10-CM | POA: Diagnosis not present

## 2021-11-15 DIAGNOSIS — R7303 Prediabetes: Secondary | ICD-10-CM | POA: Diagnosis not present

## 2021-11-15 DIAGNOSIS — E785 Hyperlipidemia, unspecified: Secondary | ICD-10-CM | POA: Diagnosis not present

## 2021-12-21 NOTE — Progress Notes (Unsigned)
CARDIOLOGY CONSULT NOTE       Patient ID: Randy Melton MRN: [id number] DOB/AGE: [id number] 67 y.o.  Admit date: (Not on file) Referring Physician: Parke Simmers Primary Physician: Renaye Rakers, MD Primary Cardiologist: New Reason for Consultation: Bradycardia     HPI:  66 y.o. referred by Dr Parke Simmers for bradycardia History of HTN, low T, History of slight atypical sharp chest pain on complaint during office visit with primary 11/15/21 Pulse in office 53 BP normal ECG SB rate 53 PAC and early repolarization in inferior lateral leads QRS 112 msec PR 146 msec QT 385 msec On no AV nodal drugs. HTN Rx with Valsartan/HCTZ  Lab review with LDL 72 K 4.3 Cr 1.06 Hct 42.1 A1c 5.9   He is separated with 5 children Quit smoking in 70's  Intermittent marijuana and cocaine [place]  Drinks beer daily and wine/spirits on weekend  ***  *** ROS All other systems reviewed and negative except as noted above  Past Medical History:  Diagnosis Date   Arthritis    Bradycardia    Hypertension    Low testosterone     Family History  Problem Relation Age of Onset   Cancer Cousin        colon     Social History   Socioeconomic History   Marital status: Legally Separated    Spouse name: Not on file   Number of children: 5   Years of education: Not on file   Highest education level: Not on file  Occupational History   Occupation: Curator     Comment: not working   Tobacco Use   Smoking status: Former    Types: Cigarettes    Quit date: 1975    Years since quitting: 48.9   Smokeless tobacco: Never  Substance and Sexual Activity   Alcohol use: Yes    Comment: 1 beer per day, on weekends spirits and wine    Drug use: Yes    Types: Cocaine    Comment: cocaine 09/14/17 and marjuana 09/16/17; intermittent since age 18   Sexual activity: Not on file  Other Topics Concern   Not on file  Social History Narrative   Not on file   Social Determinants of Health   Financial Resource Strain: Not on  file  Food Insecurity: Not on file  Transportation Needs: Not on file  Physical Activity: Not on file  Stress: Not on file  Social Connections: Not on file  Intimate Partner Violence: Not on file    Past Surgical History:  Procedure Laterality Date   COLON SURGERY     HERNIA REPAIR     KNEE SURGERY        Current Outpatient Medications:    cholecalciferol (VITAMIN D) 1000 units tablet, Take 1,000 Units by mouth daily., Disp: , Rfl:    COVID-19 mRNA bivalent vaccine, Pfizer, injection, Inject into the muscle., Disp: 0.3 mL, Rfl: 0   diazepam (VALIUM) 5 MG tablet, Take 0.5 tablets (2.5 mg total) by mouth every 6 (six) hours as needed for anxiety (spasms)., Disp: 10 tablet, Rfl: 0   etodolac (LODINE) 400 MG tablet, Take 400 mg by mouth 2 (two) times daily., Disp: , Rfl: 2   fish oil-omega-3 fatty acids 1000 MG capsule, Take 2 g by mouth daily.  , Disp: , Rfl:    Multiple Vitamin (MULTIVITAMIN) tablet, Take 1 tablet by mouth daily.  , Disp: , Rfl:    naproxen (NAPROSYN) 500 MG tablet, , Disp: , Rfl:  NON FORMULARY, , Disp: , Rfl:    NON FORMULARY, , Disp: , Rfl:    predniSONE (DELTASONE) 20 MG tablet, 3 tabs po daily x 3 days, then 2 tabs x 3 days, then 1.5 tabs x 3 days, then 1 tab x 3 days, then 0.5 tabs x 3 days, Disp: 27 tablet, Rfl: 0   valsartan-hydrochlorothiazide (DIOVAN-HCT) 80-12.5 MG tablet, TAKE 1 TABLET BY MOUTH EVERY DAY FOR BLOOD PRESSURE, Disp: , Rfl: 1    Physical Exam: There were no vitals taken for this visit.    Affect appropriate Healthy:  appears stated age Black male  HEENT: normal Neck supple with no adenopathy JVP normal no bruits no thyromegaly Lungs clear with no wheezing and good diaphragmatic motion Heart:  S1/S2 no murmur, no rub, gallop or click PMI normal Abdomen: benighn, BS positve, no tenderness, no AAA no bruit.  No HSM or HJR Distal pulses intact with no bruits No edema Neuro non-focal Skin warm and dry No muscular  weakness   Labs:   Lab Results  Component Value Date   WBC 4.0 03/20/2018   HGB 12.9 (L) 03/20/2018   HCT 38.3 (L) 03/20/2018   MCV 90.8 03/20/2018   PLT 228 03/20/2018   No results for input(s): "NA", "K", "CL", "CO2", "BUN", "CREATININE", "CALCIUM", "PROT", "BILITOT", "ALKPHOS", "ALT", "AST", "GLUCOSE" in the last 168 hours.  Invalid input(s): "LABALBU" No results found for: "CKTOTAL", "CKMB", "CKMBINDEX", "TROPONINI" No results found for: "CHOL" No results found for: "HDL" No results found for: "LDLCALC" No results found for: "TRIG" No results found for: "CHOLHDL" No results found for: "LDLDIRECT"    Radiology: No results found.  EKG: See HPI   ASSESSMENT AND PLAN:   HTN:  well controlled on diuretic and ARB avoid AV nodal drugs Bradycardia *** ETOH/Drug Use:  counseled on moderation < 10 minutes ***   *** TTE  ***  Signed: Charlton Haws 01/02/2022, 4:49 PM

## 2022-01-03 ENCOUNTER — Encounter: Payer: Self-pay | Admitting: Cardiovascular Disease

## 2022-01-03 ENCOUNTER — Other Ambulatory Visit: Payer: Self-pay

## 2022-01-03 ENCOUNTER — Ambulatory Visit: Payer: Medicare Other | Attending: Cardiovascular Disease | Admitting: Cardiovascular Disease

## 2022-01-03 VITALS — BP 120/74 | HR 73 | Ht 69.0 in | Wt 189.8 lb

## 2022-01-03 DIAGNOSIS — F1020 Alcohol dependence, uncomplicated: Secondary | ICD-10-CM

## 2022-01-03 DIAGNOSIS — I10 Essential (primary) hypertension: Secondary | ICD-10-CM

## 2022-01-03 DIAGNOSIS — R001 Bradycardia, unspecified: Secondary | ICD-10-CM

## 2022-01-03 NOTE — Progress Notes (Signed)
Placed order for attestation. 

## 2022-01-03 NOTE — Patient Instructions (Signed)
Medication Instructions:  Your physician recommends that you continue on your current medications as directed. Please refer to the Current Medication list given to you today.  *If you need a refill on your cardiac medications before your next appointment, please call your pharmacy*  Lab Work: If you have labs (blood work) drawn today and your tests are completely normal, you will receive your results only by: MyChart Message (if you have MyChart) OR A paper copy in the mail If you have any lab test that is abnormal or we need to change your treatment, we will call you to review the results.  Testing/Procedures: Your physician has requested that you have an exercise tolerance test. For further information please visit [email or web address] Please also follow instruction sheet, as given.  Follow-Up: At [facility], you and your health needs are our priority.  As part of our continuing mission to provide you with exceptional heart care, we have created designated Provider Care Teams.  These Care Teams include your primary Cardiologist (physician) and Advanced Practice Providers (APPs -  Physician Assistants and Nurse Practitioners) who all work together to provide you with the care you need, when you need it.  We recommend signing up for the patient portal called "MyChart".  Sign up information is provided on this After Visit Summary.  MyChart is used to connect with patients for Virtual Visits (Telemedicine).  Patients are able to view lab/test results, encounter notes, upcoming appointments, etc.  Non-urgent messages can be sent to your provider as well.   To learn more about what you can do with MyChart, go to [email or web address].    Your next appointment:   As needed  The format for your next appointment:   In Person  Provider:   Charlton Haws, MD      Important Information About Sugar

## 2022-01-12 ENCOUNTER — Ambulatory Visit: Payer: Medicare Other | Attending: Cardiovascular Disease

## 2022-01-12 DIAGNOSIS — I10 Essential (primary) hypertension: Secondary | ICD-10-CM | POA: Diagnosis not present

## 2022-01-12 DIAGNOSIS — R001 Bradycardia, unspecified: Secondary | ICD-10-CM | POA: Diagnosis not present

## 2022-01-12 LAB — EXERCISE TOLERANCE TEST
Angina Index: 0
Duke Treadmill Score: 8
Estimated workload: 9.1
Exercise duration (min): 7 min
Exercise duration (sec): 30 s
MPHR: 153 {beats}/min
Peak HR: 137 {beats}/min
Percent HR: 89 %
RPE: 16
Rest HR: 72 {beats}/min
ST Depression (mm): 0 mm

## 2022-02-14 DIAGNOSIS — R7303 Prediabetes: Secondary | ICD-10-CM | POA: Diagnosis not present

## 2022-02-22 DIAGNOSIS — I10 Essential (primary) hypertension: Secondary | ICD-10-CM | POA: Diagnosis not present

## 2022-02-22 DIAGNOSIS — Z86010 Personal history of colonic polyps: Secondary | ICD-10-CM | POA: Diagnosis not present

## 2022-03-01 ENCOUNTER — Ambulatory Visit (INDEPENDENT_AMBULATORY_CARE_PROVIDER_SITE_OTHER): Payer: Medicare Other

## 2022-03-01 ENCOUNTER — Ambulatory Visit: Payer: Medicare Other | Admitting: Podiatry

## 2022-03-01 DIAGNOSIS — M19071 Primary osteoarthritis, right ankle and foot: Secondary | ICD-10-CM

## 2022-03-01 DIAGNOSIS — M21611 Bunion of right foot: Secondary | ICD-10-CM

## 2022-03-01 DIAGNOSIS — M21612 Bunion of left foot: Secondary | ICD-10-CM

## 2022-03-01 DIAGNOSIS — M19072 Primary osteoarthritis, left ankle and foot: Secondary | ICD-10-CM | POA: Diagnosis not present

## 2022-03-01 NOTE — Progress Notes (Signed)
  Subjective:  Patient ID: Randy Melton, male    DOB: 18-Oct-1955,  MRN: [id number]  Painful bunion left foot  67 y.o. male presents with the above complaint. History confirmed with patient.    Objective:  Physical Exam: warm, good capillary refill, no trophic changes or ulcerative lesions, normal DP and PT pulses, and normal sensory exam.  Bilateral he has hallux valgus deformity with pain over the medial eminence, pain on the sesamoid complex and with range of motion, range of motion is limited, more severe on the left than right   Radiographs: Multiple views x-ray of left foot taken today show worsening osteoarthritis and deformity of the great toe with hallux valgus deformity increased intermetatarsal angle, peritubular spurring and joint space narrowing laterally Assessment:   1. 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 Bilateral bunions       Plan:  Patient was evaluated and treated and all questions answered.  We again discussed etiology treatment options of bunion deformity in detail.  We discussed operative and nonoperative treatment.  He is ready to proceed with surgical correction.  We discussed the risk-benefit and potential complications of surgery including but not limited to pain, swelling, infection, scar, numbness which may be temporary or permanent, chronic pain, stiffness, nerve pain or damage, wound healing problems, bone healing problems including delayed or non-union.  I recommended a first metatarsal phalangeal joint arthrodesis.  We discussed the limitations of such a procedure including the limited range of motion but how this corrects the arthritis and bony deformity together.  All questions addressed.  Informed consent was signed and reviewed.  Surgery will be scheduled mutually agreeable date   Surgical plan:  Procedure: -Left foot first metatarsal phalangeal joint arthrodesis with bone graft from  heel  Location: -[facility]  Anesthesia plan: -IV sedation with regional block  Postoperative pain plan: - Tylenol 1000 mg every 6 hours, ibuprofen 600 mg every 6 hours, gabapentin 300 mg every 8 hours x5 days, oxycodone 5 mg 1-2 tabs every 6 hours only as needed  DVT prophylaxis: -None required  WB Restrictions / DME needs: -NWB in splint postop, transition to boot at POV #1    No follow-ups on file.

## 2022-03-28 ENCOUNTER — Telehealth: Payer: Self-pay | Admitting: Urology

## 2022-03-28 DIAGNOSIS — K5730 Diverticulosis of large intestine without perforation or abscess without bleeding: Secondary | ICD-10-CM | POA: Diagnosis not present

## 2022-03-28 DIAGNOSIS — Z86010 Personal history of colonic polyps: Secondary | ICD-10-CM | POA: Diagnosis not present

## 2022-03-28 NOTE — Telephone Encounter (Signed)
DOS - 04/28/22  HALLUX MPJ FUSION LEFT --- [id number] BONE GRAFT LEFT --- [id number]  [facility] EFFECTIVE DATE - 01/23/22  DEDUCTIBLE - $0.00 OOP - $3,600.00 W/ $3,545.00 REMAINING COINSURANCE - 0%  PER [employer] WEBSITE FOR CPT CODES 91478 AND 29562 Notification or Prior Authorization is not required for the requested services   Decision ID #: [id number]

## 2022-04-28 ENCOUNTER — Other Ambulatory Visit: Payer: Self-pay | Admitting: Podiatry

## 2022-04-28 DIAGNOSIS — M2012 Hallux valgus (acquired), left foot: Secondary | ICD-10-CM | POA: Diagnosis not present

## 2022-04-28 DIAGNOSIS — M89772 Major osseous defect, left ankle and foot: Secondary | ICD-10-CM | POA: Diagnosis not present

## 2022-04-28 DIAGNOSIS — M2011 Hallux valgus (acquired), right foot: Secondary | ICD-10-CM | POA: Diagnosis not present

## 2022-04-28 DIAGNOSIS — G8918 Other acute postprocedural pain: Secondary | ICD-10-CM | POA: Diagnosis not present

## 2022-04-28 MED ORDER — IBUPROFEN 600 MG PO TABS: 600.0000 mg | ORAL_TABLET | Freq: Four times a day (QID) | ORAL | 0 refills | Status: AC | PRN

## 2022-04-28 MED ORDER — ACETAMINOPHEN 500 MG PO TABS: 1000.0000 mg | ORAL_TABLET | Freq: Four times a day (QID) | ORAL | 0 refills | Status: AC | PRN

## 2022-04-28 MED ORDER — OXYCODONE HCL 5 MG PO TABS: 5.0000 mg | ORAL_TABLET | ORAL | 0 refills | Status: DC | PRN

## 2022-04-28 MED ORDER — GABAPENTIN 300 MG PO CAPS: 300.0000 mg | ORAL_CAPSULE | Freq: Three times a day (TID) | ORAL | 0 refills | Status: DC

## 2022-04-28 NOTE — Progress Notes (Signed)
04/28/22 L 1st MPJ fusion

## 2022-05-02 ENCOUNTER — Telehealth: Payer: Self-pay | Admitting: *Deleted

## 2022-05-02 MED ORDER — DOCUSATE SODIUM 100 MG PO CAPS: 100.0000 mg | ORAL_CAPSULE | Freq: Two times a day (BID) | ORAL | 0 refills | Status: DC

## 2022-05-02 MED ORDER — SENNOSIDES 8.6 MG PO TABS: 1.0000 | ORAL_TABLET | Freq: Two times a day (BID) | ORAL | 0 refills | Status: AC

## 2022-05-02 NOTE — Telephone Encounter (Signed)
Patient is calling because he fell a couple of days ago, put a little pressure on foot to catch himself but not having any additional pain but he would like something for a stool softener, has only gone to bathroom one time since surgery, usually goes every day. Please advise.

## 2022-05-02 NOTE — Telephone Encounter (Signed)
Patient has been updated.

## 2022-05-04 ENCOUNTER — Ambulatory Visit (INDEPENDENT_AMBULATORY_CARE_PROVIDER_SITE_OTHER): Payer: Medicare Other

## 2022-05-04 ENCOUNTER — Ambulatory Visit (INDEPENDENT_AMBULATORY_CARE_PROVIDER_SITE_OTHER): Payer: Medicare Other | Admitting: Podiatry

## 2022-05-04 DIAGNOSIS — M19071 Primary osteoarthritis, right ankle and foot: Secondary | ICD-10-CM | POA: Diagnosis not present

## 2022-05-04 DIAGNOSIS — M19072 Primary osteoarthritis, left ankle and foot: Secondary | ICD-10-CM

## 2022-05-05 ENCOUNTER — Other Ambulatory Visit: Payer: Self-pay

## 2022-05-05 MED ORDER — OXYCODONE HCL 5 MG PO TABS: 5.0000 mg | ORAL_TABLET | ORAL | 0 refills | Status: AC | PRN

## 2022-05-07 ENCOUNTER — Encounter: Payer: Self-pay | Admitting: Podiatry

## 2022-05-07 NOTE — Progress Notes (Signed)
  Subjective:  Patient ID: Randy Melton, male    DOB: 09/14/1955,  MRN: [id number]  Chief Complaint  Patient presents with   Routine Post Op    POV # 1 DOS 04/28/22 - --- LEFT FOOT GREAT TOE FUSION WITH BONE GRAFT FROM HEEL     67 y.o. male returns for post-op check.  Doing well not having much pain improving  Review of Systems: Negative except as noted in the HPI. Denies N/V/F/Ch.   Objective:  There were no vitals filed for this visit. There is no height or weight on file to calculate BMI. Constitutional Well developed. Well nourished.  Vascular Foot warm and well perfused. Capillary refill normal to all digits.  Calf is soft and supple, no posterior calf or knee pain, negative Homans' sign  Neurologic Normal speech. Oriented to person, place, and time. Epicritic sensation to light touch grossly present bilaterally.  Dermatologic Skin healing well without signs of infection. Skin edges well coapted without signs of infection.  Orthopedic: Tenderness to palpation noted about the surgical site.   Multiple view plain film radiographs: Good correction noted, no complication of hardware Assessment:   1. 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  S/p foot surgery left -Progressing as expected post-operatively. -XR: Noted above no complication -WB Status: NWB in CAM boot until next visit then may begin WBAT -Sutures: Plan to remove in 2 weeks. -Medications: No refills required -Foot redressed.  May begin showering on Monday  No follow-ups on file.

## 2022-05-18 ENCOUNTER — Ambulatory Visit (INDEPENDENT_AMBULATORY_CARE_PROVIDER_SITE_OTHER): Payer: Medicare Other | Admitting: Podiatry

## 2022-05-18 DIAGNOSIS — Z9889 Other specified postprocedural states: Secondary | ICD-10-CM

## 2022-05-18 DIAGNOSIS — M19071 Primary osteoarthritis, right ankle and foot: Secondary | ICD-10-CM

## 2022-05-18 DIAGNOSIS — M19072 Primary osteoarthritis, left ankle and foot: Secondary | ICD-10-CM

## 2022-05-18 MED ORDER — OXYCODONE-ACETAMINOPHEN 5-325 MG PO TABS: 1.0000 | ORAL_TABLET | ORAL | 0 refills | Status: AC | PRN

## 2022-05-18 NOTE — Progress Notes (Signed)
  Subjective:  Patient ID: Randy Melton, male    DOB: 11/24/1955,  MRN: [id number]  Chief Complaint  Patient presents with   Follow-up    Patient states that he is doing ok. Minor pain. Would like a refill on Oxycodone.      67 y.o. male returns for post-op check.  Doing well not having much pain improving.  Has been wearing the cam boot nonweightbearing as instructed  Review of Systems: Negative except as noted in the HPI. Denies N/V/F/Ch.   Objective:  There were no vitals filed for this visit. There is no height or weight on file to calculate BMI. Constitutional Well developed. Well nourished.  Vascular Foot warm and well perfused. Capillary refill normal to all digits.  Calf is soft and supple, no posterior calf or knee pain, negative Homans' sign  Neurologic Normal speech. Oriented to person, place, and time. Epicritic sensation to light touch grossly present bilaterally.  Dermatologic Skin healing well without signs of infection. Skin edges well coapted without signs of infection.  Orthopedic: Decreased tenderness to palpation noted about the surgical site.   Multiple view plain film radiographs: Deferred at this visit Assessment:   1. 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 Post-operative state     Plan:  Patient was evaluated and treated and all questions answered.  S/p foot surgery left -Progressing as expected post-operatively.  Pain is decreasing overall doing very well -XR: Deferred at this visit -WB Status: Transition to WBAT in CAM boot -Sutures: Plan to remove in 2 weeks. -Medications: Refill of Percocet 5-3 25 sent for patient -Foot redressed with Steri-Strips and Ace wrap and stockinette.  Patient can continue to cover the surgical site with an Ace wrap or stockinette.  Continue to wash the foot as he was doing previously.  No follow-ups on file.

## 2022-05-29 ENCOUNTER — Telehealth (INDEPENDENT_AMBULATORY_CARE_PROVIDER_SITE_OTHER): Payer: Medicare Other | Admitting: Podiatry

## 2022-05-29 ENCOUNTER — Ambulatory Visit: Payer: Medicare Other

## 2022-05-29 DIAGNOSIS — Z9889 Other specified postprocedural states: Secondary | ICD-10-CM

## 2022-05-29 NOTE — Telephone Encounter (Signed)
Patient seen in office today for dressing change and reapplication of steri-strips.

## 2022-05-29 NOTE — Telephone Encounter (Signed)
Patient LVM. Dressing and steri-strips came off operative foot 3 days ago and wound exposed.   Phoned patient back at 1:23 pm. He has been showering with wound exposed. Still has a little pain and swelling. No odor or drainage. No fever, chills, nightsweats, nausea or vomiting.

## 2022-05-29 NOTE — Progress Notes (Signed)
Patient in office today for dressing change.   Patient states that his dressing came off along with the steri-strips and he wanted to make sure everything was okay with the incision site.   Incision site looks like the scab has come off, steri-strips re-applied to the area and covered with a telfa non-stick dressing followed by keflex and Ace wrap.   Patient advised to continue monitoring for signs and symptoms of infection and call the office immediately if they occur. Patient verbalized understanding.

## 2022-06-08 ENCOUNTER — Ambulatory Visit (INDEPENDENT_AMBULATORY_CARE_PROVIDER_SITE_OTHER): Payer: Medicare Other | Admitting: Podiatry

## 2022-06-08 ENCOUNTER — Ambulatory Visit (INDEPENDENT_AMBULATORY_CARE_PROVIDER_SITE_OTHER): Payer: Medicare Other

## 2022-06-08 DIAGNOSIS — M19072 Primary osteoarthritis, left ankle and foot: Secondary | ICD-10-CM

## 2022-06-08 DIAGNOSIS — M19071 Primary osteoarthritis, right ankle and foot: Secondary | ICD-10-CM

## 2022-06-08 NOTE — Progress Notes (Signed)
  Subjective:  Patient ID: Randy Melton, male    DOB: Nov 15, 1955,  MRN: [id number]  Chief Complaint  Patient presents with   Routine Post Op    POV # 3 DOS 04/28/22 - --- LEFT FOOT GREAT TOE FUSION WITH BONE GRAFT FROM HEEL     67 y.o. male returns for post-op check.  Overall he is doing well  Review of Systems: Negative except as noted in the HPI. Denies N/V/F/Ch.   Objective:  There were no vitals filed for this visit. There is no height or weight on file to calculate BMI. Constitutional Well developed. Well nourished.  Vascular Foot warm and well perfused. Capillary refill normal to all digits.  Calf is soft and supple, no posterior calf or knee pain, negative Homans' sign  Neurologic Normal speech. Oriented to person, place, and time. Epicritic sensation to light touch grossly present bilaterally.  Dermatologic Incision is well-healed with the exception of a small scab in the central portion, no signs infection or drainage  Orthopedic: He has some edema, mild tenderness to palpation noted about the surgical site.   Multiple view plain film radiographs: Correction maintained, no complication of hardware there is good consolidation across the fusion site Assessment:   1. 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  S/p foot surgery left -Overall doing much better.  May be full weightbearing as tolerated in the cam walker boot.  Return in 4 weeks for new x-rays.  Should be able to transition back to regular shoe gear at that point.  Can leave scab open to air at this point.  Return in about 4 weeks (around 07/06/2022) for post op (new x-rays).

## 2022-06-20 DIAGNOSIS — M130 Polyarthritis, unspecified: Secondary | ICD-10-CM | POA: Diagnosis not present

## 2022-06-20 DIAGNOSIS — M21622 Bunionette of left foot: Secondary | ICD-10-CM | POA: Diagnosis not present

## 2022-06-20 DIAGNOSIS — R7303 Prediabetes: Secondary | ICD-10-CM | POA: Diagnosis not present

## 2022-06-20 DIAGNOSIS — E785 Hyperlipidemia, unspecified: Secondary | ICD-10-CM | POA: Diagnosis not present

## 2022-06-20 DIAGNOSIS — I10 Essential (primary) hypertension: Secondary | ICD-10-CM | POA: Diagnosis not present

## 2022-07-06 ENCOUNTER — Encounter: Payer: Medicare Other | Admitting: Podiatry

## 2022-07-06 ENCOUNTER — Ambulatory Visit (INDEPENDENT_AMBULATORY_CARE_PROVIDER_SITE_OTHER): Payer: Medicare Other

## 2022-07-06 ENCOUNTER — Ambulatory Visit (INDEPENDENT_AMBULATORY_CARE_PROVIDER_SITE_OTHER): Payer: Medicare Other | Admitting: Podiatry

## 2022-07-06 DIAGNOSIS — Z9889 Other specified postprocedural states: Secondary | ICD-10-CM

## 2022-07-06 DIAGNOSIS — M19072 Primary osteoarthritis, left ankle and foot: Secondary | ICD-10-CM

## 2022-07-06 DIAGNOSIS — M79672 Pain in left foot: Secondary | ICD-10-CM

## 2022-07-06 DIAGNOSIS — M19071 Primary osteoarthritis, right ankle and foot: Secondary | ICD-10-CM

## 2022-07-09 NOTE — Progress Notes (Signed)
  Subjective:  Patient ID: Randy Melton, male    DOB: 06/16/1955,  MRN: [id number]  Chief Complaint  Patient presents with   Routine Post Op    POV # 4DOS 04/28/22 - --- LEFT FOOT GREAT TOE FUSION WITH BONE GRAFT FROM HEEL     67 y.o. male returns for post-op check.  Overall he is doing well  Review of Systems: Negative except as noted in the HPI. Denies N/V/F/Ch.   Objective:  There were no vitals filed for this visit. There is no height or weight on file to calculate BMI. Constitutional Well developed. Well nourished.  Vascular Foot warm and well perfused. Capillary refill normal to all digits.  Calf is soft and supple, no posterior calf or knee pain, negative Homans' sign  Neurologic Normal speech. Oriented to person, place, and time. Epicritic sensation to light touch grossly present bilaterally.  Dermatologic Well healed non hypertrophic   Orthopedic: No edema and no pain    Multiple view plain film radiographs: Correction maintained, no complication of hardware there is good consolidation across the fusion site Assessment:   1. 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  S/p foot surgery left -Doing well may resume regular shoe gear and activity. Plan for new final xrays in 6 weeks, expect will be able to follow up PRN after that  Return in about 6 weeks (around 08/17/2022) for post op (new x-rays).

## 2022-08-22 ENCOUNTER — Ambulatory Visit (INDEPENDENT_AMBULATORY_CARE_PROVIDER_SITE_OTHER): Payer: Medicare Other | Admitting: Podiatry

## 2022-08-22 ENCOUNTER — Encounter: Payer: Self-pay | Admitting: Podiatry

## 2022-08-22 ENCOUNTER — Ambulatory Visit (INDEPENDENT_AMBULATORY_CARE_PROVIDER_SITE_OTHER): Payer: Medicare Other

## 2022-08-22 DIAGNOSIS — M19072 Primary osteoarthritis, left ankle and foot: Secondary | ICD-10-CM

## 2022-08-22 DIAGNOSIS — Z9889 Other specified postprocedural states: Secondary | ICD-10-CM

## 2022-08-22 DIAGNOSIS — M19071 Primary osteoarthritis, right ankle and foot: Secondary | ICD-10-CM | POA: Diagnosis not present

## 2022-08-22 NOTE — Progress Notes (Signed)
  Subjective:  Patient ID: Randy Melton, male    DOB: Aug 05, 1955,  MRN: [id number]  Chief Complaint  Patient presents with   Routine Post Op    "It's doing okay, it still swells up at night after being up on it all day."     67 y.o. male returns for post-op check.  He is nearly 4 months postop left first MPJ fusion.  Does note some swelling still but overall doing well does not hurt  Review of Systems: Negative except as noted in the HPI. Denies N/V/F/Ch.   Objective:  There were no vitals filed for this visit.  Constitutional Well developed. Well nourished.  Vascular Foot warm and well perfused. Capillary refill normal to all digits.  Calf is soft and supple, no posterior calf or knee pain, negative Homans' sign  Neurologic Normal speech. Oriented to person, place, and time. Epicritic sensation to light touch grossly present bilaterally.  Dermatologic Well healed non hypertrophic   Orthopedic: Minimal edema mostly local lysed around his ankle and no pain at surgical site or in ankle    Multiple view plain film radiographs: Has complete fusion across fusion site Assessment:   1. Post-operative state   2. 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  S/p foot surgery left Overall doing very well occasional swelling, expect this to improve with time and activity encouraged him to try to exercise and walk daily, he is in regular shoe gear and comfortable.  Not having any pain.  Return to see me as needed if he has any further issues. Return if symptoms worsen or fail to improve.

## 2022-09-07 DIAGNOSIS — H25813 Combined forms of age-related cataract, bilateral: Secondary | ICD-10-CM | POA: Diagnosis not present

## 2022-09-28 DIAGNOSIS — H25813 Combined forms of age-related cataract, bilateral: Secondary | ICD-10-CM | POA: Diagnosis not present

## 2022-10-18 DIAGNOSIS — H25812 Combined forms of age-related cataract, left eye: Secondary | ICD-10-CM | POA: Diagnosis not present

## 2022-10-24 DIAGNOSIS — I10 Essential (primary) hypertension: Secondary | ICD-10-CM | POA: Diagnosis not present

## 2022-10-24 DIAGNOSIS — E559 Vitamin D deficiency, unspecified: Secondary | ICD-10-CM | POA: Diagnosis not present

## 2022-10-24 DIAGNOSIS — E785 Hyperlipidemia, unspecified: Secondary | ICD-10-CM | POA: Diagnosis not present

## 2022-10-24 DIAGNOSIS — R7303 Prediabetes: Secondary | ICD-10-CM | POA: Diagnosis not present

## 2022-10-24 DIAGNOSIS — M130 Polyarthritis, unspecified: Secondary | ICD-10-CM | POA: Diagnosis not present

## 2023-01-25 DIAGNOSIS — H25811 Combined forms of age-related cataract, right eye: Secondary | ICD-10-CM | POA: Diagnosis not present

## 2023-02-07 DIAGNOSIS — H25811 Combined forms of age-related cataract, right eye: Secondary | ICD-10-CM | POA: Diagnosis not present

## 2023-02-07 DIAGNOSIS — I10 Essential (primary) hypertension: Secondary | ICD-10-CM | POA: Diagnosis not present

## 2023-02-27 DIAGNOSIS — R7303 Prediabetes: Secondary | ICD-10-CM | POA: Diagnosis not present

## 2023-02-27 DIAGNOSIS — E785 Hyperlipidemia, unspecified: Secondary | ICD-10-CM | POA: Diagnosis not present

## 2023-02-27 DIAGNOSIS — I10 Essential (primary) hypertension: Secondary | ICD-10-CM | POA: Diagnosis not present

## 2023-05-24 DIAGNOSIS — R7303 Prediabetes: Secondary | ICD-10-CM | POA: Diagnosis not present

## 2023-05-24 DIAGNOSIS — E785 Hyperlipidemia, unspecified: Secondary | ICD-10-CM | POA: Diagnosis not present

## 2023-05-24 DIAGNOSIS — I10 Essential (primary) hypertension: Secondary | ICD-10-CM | POA: Diagnosis not present

## 2023-06-21 DIAGNOSIS — H26493 Other secondary cataract, bilateral: Secondary | ICD-10-CM | POA: Diagnosis not present

## 2023-06-21 DIAGNOSIS — H35033 Hypertensive retinopathy, bilateral: Secondary | ICD-10-CM | POA: Diagnosis not present

## 2023-06-21 DIAGNOSIS — H52223 Regular astigmatism, bilateral: Secondary | ICD-10-CM | POA: Diagnosis not present

## 2023-06-21 DIAGNOSIS — H5201 Hypermetropia, right eye: Secondary | ICD-10-CM | POA: Diagnosis not present

## 2023-07-03 DIAGNOSIS — R079 Chest pain, unspecified: Secondary | ICD-10-CM | POA: Diagnosis not present

## 2023-07-03 DIAGNOSIS — I10 Essential (primary) hypertension: Secondary | ICD-10-CM | POA: Diagnosis not present

## 2023-07-03 DIAGNOSIS — R7303 Prediabetes: Secondary | ICD-10-CM | POA: Diagnosis not present

## 2023-07-03 DIAGNOSIS — M130 Polyarthritis, unspecified: Secondary | ICD-10-CM | POA: Diagnosis not present

## 2023-10-04 DIAGNOSIS — R7303 Prediabetes: Secondary | ICD-10-CM | POA: Diagnosis not present

## 2023-10-04 DIAGNOSIS — E7800 Pure hypercholesterolemia, unspecified: Secondary | ICD-10-CM | POA: Diagnosis not present

## 2023-10-04 DIAGNOSIS — I10 Essential (primary) hypertension: Secondary | ICD-10-CM | POA: Diagnosis not present

## 2023-10-04 DIAGNOSIS — E785 Hyperlipidemia, unspecified: Secondary | ICD-10-CM | POA: Diagnosis not present

## 2023-10-23 DIAGNOSIS — R7303 Prediabetes: Secondary | ICD-10-CM | POA: Diagnosis not present

## 2023-10-23 DIAGNOSIS — I10 Essential (primary) hypertension: Secondary | ICD-10-CM | POA: Diagnosis not present

## 2023-10-23 DIAGNOSIS — Z2821 Immunization not carried out because of patient refusal: Secondary | ICD-10-CM | POA: Diagnosis not present

## 2023-11-29 ENCOUNTER — Other Ambulatory Visit: Payer: Self-pay

## 2023-11-29 ENCOUNTER — Observation Stay (HOSPITAL_COMMUNITY)
Admission: EM | Admit: 2023-11-29 | Discharge: 2023-11-30 | Disposition: A | Discharge status: Home / Self Care | Attending: Emergency Medicine | Admitting: Emergency Medicine

## 2023-11-29 ENCOUNTER — Encounter (HOSPITAL_COMMUNITY): Payer: Self-pay

## 2023-11-29 ENCOUNTER — Emergency Department (HOSPITAL_COMMUNITY)

## 2023-11-29 DIAGNOSIS — Z87891 Personal history of nicotine dependence: Secondary | ICD-10-CM | POA: Diagnosis not present

## 2023-11-29 DIAGNOSIS — R7989 Other specified abnormal findings of blood chemistry: Secondary | ICD-10-CM | POA: Diagnosis not present

## 2023-11-29 DIAGNOSIS — I2489 Other forms of acute ischemic heart disease: Secondary | ICD-10-CM | POA: Diagnosis not present

## 2023-11-29 DIAGNOSIS — I4892 Unspecified atrial flutter: Secondary | ICD-10-CM | POA: Diagnosis not present

## 2023-11-29 DIAGNOSIS — F10920 Alcohol use, unspecified with intoxication, uncomplicated: Secondary | ICD-10-CM | POA: Insufficient documentation

## 2023-11-29 DIAGNOSIS — R079 Chest pain, unspecified: Secondary | ICD-10-CM | POA: Diagnosis present

## 2023-11-29 DIAGNOSIS — Z7901 Long term (current) use of anticoagulants: Secondary | ICD-10-CM | POA: Insufficient documentation

## 2023-11-29 DIAGNOSIS — Z79899 Other long term (current) drug therapy: Secondary | ICD-10-CM | POA: Insufficient documentation

## 2023-11-29 DIAGNOSIS — I4891 Unspecified atrial fibrillation: Secondary | ICD-10-CM | POA: Diagnosis present

## 2023-11-29 DIAGNOSIS — I10 Essential (primary) hypertension: Secondary | ICD-10-CM | POA: Insufficient documentation

## 2023-11-29 DIAGNOSIS — F14920 Cocaine use, unspecified with intoxication, uncomplicated: Secondary | ICD-10-CM | POA: Insufficient documentation

## 2023-11-29 LAB — BASIC METABOLIC PANEL WITH GFR
Anion gap: 10 (ref 5–15)
BUN: 15 mg/dL (ref 8–23)
CO2: 22 mmol/L (ref 22–32)
Calcium: 9.6 mg/dL (ref 8.9–10.3)
Chloride: 105 mmol/L (ref 98–111)
Creatinine, Ser: 0.82 mg/dL (ref 0.61–1.24)
GFR, Estimated: >60 mL/min (ref >60)
Glucose, Bld: 93 mg/dL (ref 70–99)
Potassium: 4.7 mmol/L (ref 3.5–5.1)
Sodium: 137 mmol/L (ref 135–145)

## 2023-11-29 LAB — CBC
HCT: 49.5 % (ref 39.0–52.0)
Hemoglobin: 16.6 g/dL (ref 13.0–17.0)
MCH: 30.2 pg (ref 26.0–34.0)
MCHC: 33.5 g/dL (ref 30.0–36.0)
MCV: 90 fL (ref 80.0–100.0)
Platelets: 271 K/uL (ref 150–400)
RBC: 5.5 MIL/uL (ref 4.22–5.81)
RDW: 11.9 % (ref 11.5–15.5)
WBC: 3.2 K/uL — ABNORMAL LOW (ref 4.0–10.5)
nRBC: 0 % (ref 0.0–0.2)

## 2023-11-29 LAB — TROPONIN I (HIGH SENSITIVITY)
Troponin I (High Sensitivity): 45 ng/L — ABNORMAL HIGH (ref <18)
Troponin I (High Sensitivity): 62 ng/L — ABNORMAL HIGH (ref <18)

## 2023-11-29 LAB — D-DIMER, QUANTITATIVE: D-Dimer, Quant: 0.59 ug{FEU}/mL — ABNORMAL HIGH (ref 0.00–0.50)

## 2023-11-29 LAB — TSH: TSH: 1.476 u[IU]/mL (ref 0.350–4.500)

## 2023-11-29 LAB — MAGNESIUM: Magnesium: 2 mg/dL (ref 1.7–2.4)

## 2023-11-29 MED ORDER — DILTIAZEM HCL-DEXTROSE 125-5 MG/125ML-% IV SOLN (PREMIX)
5.0000 mg/h | INTRAVENOUS | Status: DC
  Administered 2023-11-29: 5 mg/h via INTRAVENOUS
  Filled 2023-11-29 (×2): qty 125

## 2023-11-29 MED ORDER — APIXABAN 5 MG PO TABS
5.0000 mg | ORAL_TABLET | Freq: Two times a day (BID) | ORAL | Status: DC
  Administered 2023-11-29 – 2023-11-30 (×2): 5 mg via ORAL
  Filled 2023-11-29 (×2): qty 1

## 2023-11-29 MED ORDER — DILTIAZEM HCL ER COATED BEADS 120 MG PO CP24
120.0000 mg | ORAL_CAPSULE | Freq: Once | ORAL | Status: AC
  Administered 2023-11-29: 120 mg via ORAL
  Filled 2023-11-29 (×2): qty 1

## 2023-11-29 MED ORDER — DILTIAZEM LOAD VIA INFUSION
15.0000 mg | Freq: Once | INTRAVENOUS | Status: AC
  Administered 2023-11-29: 15 mg via INTRAVENOUS
  Filled 2023-11-29: qty 15

## 2023-11-29 MED ORDER — ASPIRIN 81 MG PO CHEW
324.0000 mg | CHEWABLE_TABLET | Freq: Once | ORAL | Status: AC
  Administered 2023-11-29: 324 mg via ORAL
  Filled 2023-11-29: qty 4

## 2023-11-29 NOTE — ED Triage Notes (Signed)
 Patient sent by Dr Vickii office for new onset afib with RVR. Patient endorses chest pain since yesterday around 1730 with intermittent episodes previously.

## 2023-11-29 NOTE — ED Triage Notes (Signed)
 Pt is here due to sudden onset atrial fibrillation

## 2023-11-29 NOTE — Consult Note (Signed)
 Cardiology Consultation   Patient ID: Randy Melton MRN: [id number]; DOB: 01-07-1956  Admit date: 11/29/2023 Date of Consult: 11/29/2023  PCP:  Randy Aland, MD   [facility] Providers Cardiologist:  Randy Emmer, MD        Patient Profile: Randy Melton is a 68 y.o. male with a hx of HTN, low testosterone  who is being seen 11/29/2023 for the evaluation of a flutter with RVR at the request of ED.  History of Present Illness: Randy Melton 68 year old male with history of hypertension who presents for intermittent palpitations and chest pain.  Has had subacute symptoms that are short lasting but yesterday evening was working in the lawn and exerting himself and developed left-sided chest pain, positional, mild shortness of breath.  Denies syncope or diaphoresis.  Went to the doctor's office today was found to be in A-fib/flutter with RVR so was sent to the ED.  In the ED, overall afebrile and hemodynamically stable with heart rates as high as 144.  EKG with atrial flutter with variable block.  He was started on diltiazem for rate control.  Labs were notable for K4.7, creatinine 0.82, white count 3.2, platelets 271.  TSH normal.  Trope 45-62.  Chest x-ray unremarkable.  Past Medical History:  Diagnosis Date   Arthritis    Bradycardia    Hypertension    Low testosterone      Past Surgical History:  Procedure Laterality Date   COLON SURGERY     HERNIA REPAIR     KNEE SURGERY       Home Medications:  Prior to Admission medications   Medication Sig Start Date End Date Taking? Authorizing Provider  cholecalciferol (VITAMIN D) 1000 units tablet Take 1,000 Units by mouth daily.    [provider]  COVID-19 mRNA bivalent vaccine, Pfizer, injection Inject into the muscle. 10/12/20   Luiz Channel, MD  diazepam  (VALIUM ) 5 MG tablet Take 0.5 tablets (2.5 mg total) by mouth every 6 (six) hours as needed for anxiety (spasms). Patient not taking: Reported on  01/03/2022 09/09/18   Mesner, Selinda, MD  docusate sodium  (COLACE) 100 MG capsule Take 1 capsule (100 mg total) by mouth 2 (two) times daily. 05/02/22   McDonald, Juliene SAUNDERS, DPM  etodolac (LODINE) 400 MG tablet Take 400 mg by mouth 2 (two) times daily. 09/02/17   [provider]  fish oil-omega-3 fatty acids 1000 MG capsule Take 2 g by mouth daily.      [provider]  gabapentin  (NEURONTIN ) 300 MG capsule Take 1 capsule (300 mg total) by mouth 3 (three) times daily for 7 days. 04/28/22 05/05/22  Silva Juliene SAUNDERS, DPM  Multiple Vitamin (MULTIVITAMIN) tablet Take 1 tablet by mouth daily.      [provider]  naproxen (NAPROSYN) 500 MG tablet  06/07/20   [provider]  NON FORMULARY     [provider]  NON FORMULARY     [provider]  predniSONE  (DELTASONE ) 20 MG tablet 3 tabs po daily x 3 days, then 2 tabs x 3 days, then 1.5 tabs x 3 days, then 1 tab x 3 days, then 0.5 tabs x 3 days Patient not taking: Reported on 01/03/2022 09/09/18   Mesner, Selinda, MD  valsartan-hydrochlorothiazide (DIOVAN-HCT) 80-12.5 MG tablet TAKE 1 TABLET BY MOUTH EVERY DAY FOR BLOOD PRESSURE 09/06/17   [provider]    Scheduled Meds:  apixaban  5 mg Oral BID   Continuous Infusions:  diltiazem (CARDIZEM) infusion 15  mg/hr (11/29/23 2101)   PRN Meds:   Allergies:   No Known Allergies  Social History:   Social History   Socioeconomic History   Marital status: Legally Separated    Spouse name: Not on file   Number of children: 5   Years of education: Not on file   Highest education level: Not on file  Occupational History   Occupation: curator     Comment: not working   Tobacco Use   Smoking status: Former    Current packs/day: 0.00    Types: Cigarettes    Quit date: 1975    Years since quitting: 50.8   Smokeless tobacco: Never  Substance and Sexual Activity   Alcohol use: Yes    Comment: 1 beer per day, on weekends spirits and wine    Drug  use: Not Currently    Types: Cocaine    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  All other ROS reviewed and negative.     Physical Exam/Data: Vitals:   11/29/23 2130 11/29/23 2158 11/29/23 2215 11/29/23 2230  BP: (!) 124/92  117/85 108/85  Pulse: 83 (!) 116 (!) 51 (!) 50  Resp: 19 18 (!) 22 16  Temp:      SpO2: 99% 100% 99% 99%   No intake or output data in the 24 hours ending 11/29/23 2357    01/03/2022    1:52 PM 09/08/2018    9:12 PM 03/20/2018   11:54 AM  Last 3 Weights  Weight (lbs) 189 lb 12.8 oz 180 lb 192 lb 3.2 oz  Weight (kg) 86.093 kg 81.647 kg 87.181 kg     There is no height or weight on file to calculate BMI.  General:  Well nourished, well developed, in no acute distress HEENT: normal Neck: no JVD Vascular: No carotid bruits; Distal pulses 2+ bilaterally Cardiac:  normal S1, S2; tachycardic, irregular ; no murmur  Lungs:  clear to auscultation bilaterally, no wheezing, rhonchi or rales  Abd: soft, nontender, no hepatomegaly  Ext: no edema Musculoskeletal:  No deformities, BUE and BLE strength normal and equal Skin: warm and dry  Neuro:  CNs 2-12 intact, no focal abnormalities noted Psych:  Normal affect   EKG:  The EKG was personally reviewed and demonstrates:  Aflutter with RVR Telemetry:  Telemetry was personally reviewed and demonstrates:  same  Relevant CV Studies:  Exercise treadmill 2023    Good exercise capacity, achieved 9.1 METS   Peak HR 137 bpm (89% max age predicted HR)   Normal BP response to exercise   No ST deviation was noted.   Low risk study  Laboratory Data: High Sensitivity Troponin:   Recent Labs  Lab 11/29/23 1933 11/29/23 2118  TROPONINIHS 45* 62*     Chemistry Recent Labs  Lab  11/29/23 1933  NA 137  K 4.7  CL 105  CO2 22  GLUCOSE 93  BUN 15  CREATININE 0.82  CALCIUM 9.6  MG 2.0  GFRNONAA >60  ANIONGAP 10    No results for input(s): PROT, ALBUMIN, AST, ALT, ALKPHOS, BILITOT in the last 168 hours. Lipids No results for input(s): CHOL, TRIG, HDL, LABVLDL, LDLCALC, CHOLHDL in the last 168 hours.  Hematology Recent Labs  Lab 11/29/23 1933  WBC 3.2*  RBC 5.50  HGB 16.6  HCT 49.5  MCV 90.0  MCH 30.2  MCHC 33.5  RDW 11.9  PLT 271   Thyroid  Recent Labs  Lab 11/29/23 1933  TSH 1.476    BNPNo results for input(s): BNP, PROBNP in the last 168 hours.  DDimer  Recent Labs  Lab 11/29/23 1933  DDIMER 0.59*    Radiology/Studies:  DG Chest Port 1 View Result Date: 11/29/2023 CLINICAL DATA:  Chest pain. EXAM: PORTABLE CHEST 1 VIEW COMPARISON:  Chest radiograph dated 05/17/2009. FINDINGS: The heart size and mediastinal contours are within normal limits. Both lungs are clear. The visualized skeletal structures are unremarkable. IMPRESSION: No active disease. Electronically Signed   By: Vanetta Chou M.D.   On: 11/29/2023 19:22   Assessment and Plan: Symptomatic atrial flutter with variable block and rapid ventricular rate Newly diagnosed, possible symptoms last week and has not been on anticoagulation, given greater than 48 hours and currently symptomatic he would benefit from TEE cardioversion tomorrow.  Please keep n.p.o. at midnight.  Will start Eliquis for anticoagulant.  TTE in the morning.  Diltiazem for rate control. - Continue diltiazem gtt for rate control - Start Eliquis 5 mg twice daily - N.p.o.  for TEE cardioversion - Order for formal structural TTE  Troponin elevation secondary to demand ischemia Presentation most consistent with symptomatic atrial flutter, troponin elevation likely in the setting demand ischemia.  Further ischemic evaluation as an outpatient.  Hypertension Reasonably well-controlled, continue home antihypertensives  Risk Assessment/Risk Scores:         CHA2DS2-VASc Score =  2  This indicates a  % annual risk of stroke. The patient's score is based upon:          For questions or updates, please contact [facility] Please consult [email or web address] for contact info under   Signed, Ozell Bushman, MD  11/29/2023 11:57 PM

## 2023-11-29 NOTE — ED Provider Notes (Addendum)
 [place] EMERGENCY DEPARTMENT AT [facility] Provider Note   CSN: [id number] Arrival date & time: 11/29/23  1817     Patient presents with: Atrial Fibrillation   Randy Melton is a 68 y.o. male.   68 year old male with a history of hypertension and testosterone  use who presents to the emergency department chest pain.  Patient reports that yesterday at 5:30 PM he was working on a lawnmower and exerting himself.  Says that he started experiencing left-sided chest pain that felt like indigestion.  Says it is pleuritic and positional.  No diaphoresis or vomiting.  Mild shortness of breath.  No palpitations.  His outpatient doctors office and was found to be in A-fib with RVR.  Says that he occasionally will have chest discomfort like this.  Last episode was a week ago.  Smokes marijuana and occasionally drinks alcohol but no heavy use recently.       Prior to Admission medications   Medication Sig Start Date End Date Taking? Authorizing Provider  diltiazem (CARDIZEM) 30 MG tablet Take 1 tablet (30 mg total) by mouth every 6 (six) hours as needed (For heart rate greater than 100 bpm.). 11/30/23 11/29/24 Yes Rosario Leatrice FERNS, MD  apixaban (ELIQUIS) 5 MG TABS tablet Take 1 tablet (5 mg total) by mouth 2 (two) times daily. 11/30/23   Rosario Leatrice FERNS, MD    Allergies: Patient has no known allergies.    Review of Systems  Updated Vital Signs BP 109/80   Pulse 61   Temp 98.1 F (36.7 C)   Resp 19   SpO2 100%   Physical Exam Vitals and nursing note reviewed.  Constitutional:      General: He is not in acute distress.    Appearance: He is well-developed.  HENT:     Head: Normocephalic and atraumatic.     Right Ear: External ear normal.     Left Ear: External ear normal.     Nose: Nose normal.  Eyes:     Extraocular Movements: Extraocular movements intact.     Conjunctiva/sclera: Conjunctivae normal.     Pupils: Pupils are equal, round, and reactive to light.   Cardiovascular:     Rate and Rhythm: Regular rhythm. Tachycardia present.     Heart sounds: Normal heart sounds.     Comments: Radial pulses 2+ bilaterally Pulmonary:     Effort: Pulmonary effort is normal. No respiratory distress.     Breath sounds: Normal breath sounds.  Musculoskeletal:     Cervical back: Normal range of motion and neck supple.     Right lower leg: No edema.     Left lower leg: No edema.  Skin:    General: Skin is warm and dry.  Neurological:     Mental Status: He is alert. Mental status is at baseline.  Psychiatric:        Mood and Affect: Mood normal.        Behavior: Behavior normal.     (all labs ordered are listed, but only abnormal results are displayed) Labs Reviewed  CBC - Abnormal; Notable for the following components:      Result Value   WBC 3.2 (*)    All other components within normal limits  D-DIMER, QUANTITATIVE - Abnormal; Notable for the following components:   D-Dimer, Quant 0.59 (*)    All other components within normal limits  RAPID URINE DRUG SCREEN, HOSP PERFORMED - Abnormal; Notable for the following components:   Cocaine POSITIVE (*)  Tetrahydrocannabinol POSITIVE (*)    All other components within normal limits  TROPONIN I (HIGH SENSITIVITY) - Abnormal; Notable for the following components:   Troponin I (High Sensitivity) 45 (*)    All other components within normal limits  TROPONIN I (HIGH SENSITIVITY) - Abnormal; Notable for the following components:   Troponin I (High Sensitivity) 62 (*)    All other components within normal limits  BASIC METABOLIC PANEL WITH GFR  MAGNESIUM  TSH  HIV ANTIBODY (ROUTINE TESTING W REFLEX)    EKG: EKG Interpretation Date/Time:  Thursday November 29 2023 20:13:47 EST Ventricular Rate:  98 PR Interval:    QRS Duration:  117 QT Interval:  355 QTC Calculation: 454 R Axis:   -11  Text Interpretation: Atrial flutter with predominant 3:1 AV block Nonspecific intraventricular conduction  delay Anteroseptal infarct, age indeterminate Lateral leads are also involved Confirmed by Yolande Charleston [phone]) on 11/29/2023 8:54:05 PM  Radiology: [facility] Chest Port 1 View Result Date: 11/29/2023 CLINICAL DATA:  Chest pain. EXAM: PORTABLE CHEST 1 VIEW COMPARISON:  Chest radiograph dated 05/17/2009. FINDINGS: The heart size and mediastinal contours are within normal limits. Both lungs are clear. The visualized skeletal structures are unremarkable. IMPRESSION: No active disease. Electronically Signed   By: Vanetta Chou M.D.   On: 11/29/2023 19:22     Procedures   Medications Ordered in the ED  diltiazem (CARDIZEM) 1 mg/mL load via infusion 15 mg (15 mg Intravenous Bolus from Bag 11/29/23 1946)  diltiazem (CARDIZEM CD) 24 hr capsule 120 mg (120 mg Oral Given 11/29/23 2104)  aspirin chewable tablet 324 mg (324 mg Oral Given 11/29/23 2326)  sodium chloride 0.9 % bolus 1,000 mL (0 mLs Intravenous Stopped 11/30/23 0737)    Clinical Course as of 12/01/23 0044  Thu Nov 29, 2023  2037 D-Dimer, Quant(!): 0.59 Within age-adjusted limits [RP]  2302 Dr Vonda from cardiology consulted.  [RP]  Fri Nov 30, 2023  0022 Dr Alfornia from hospitalist consulted for admission.  [RP]    Clinical Course User Index [RP] Yolande Charleston BROCKS, MD                                 Medical Decision Making Amount and/or Complexity of Data Reviewed Labs: ordered. Decision-making details documented in ED Course. Radiology: ordered.  Risk OTC drugs. Prescription drug management.   68 year old male with a history of hypertension and testosterone  use who presents to the emergency department chest pain.   Initial Ddx:  Atrial fibrillation RVR, demand ischemia, MI, PE, dissection  MDM/Course:  Patient presents to the emergency department with chest pain.  Denies any palpitations but appears to be in atrial fibrillation with RVR on the monitor.  Blood pressure is 127/100.  He is mentating well and not in acute  distress.  Was given diltiazem and had some improvement of his heart rate but upon re-evaluation was still on the max dose of the drip and heart rate remained 100.  Was given oral Cardizem because the patient was requesting to go home but unfortunately was unable to be weaned from the Cardizem drip.  Did have elevated troponins at 45-62 which I suspect is due to demand.  Did give him aspirin and discussed with cardiology who recommended starting him on Eliquis instead of heparin (CHA2DS2-VASc of 2) and they agree that ACS is unlikely the cause of his troponin elevation.  Did have a D-dimer that was sent and  is mildly elevated but was within age-adjusted limits so feel that PE is unlikely.  Admitted to hospitalist for further management of his atrial flutter RVR  This patient presents to the ED for concern of complaints listed in HPI, this involves an extensive number of treatment options, and is a complaint that carries with it a high risk of complications and morbidity. Disposition including potential need for admission considered.   Dispo: Admit  Additional history obtained from significant other Records reviewed Outpatient Clinic Notes The following labs were independently interpreted: Chemistry and show no acute abnormality I independently reviewed the following imaging with scope of interpretation limited to determining acute life threatening conditions related to emergency care: Chest x-ray and agree with the radiologist interpretation with the following exceptions: none I personally reviewed and interpreted cardiac monitoring: Atrial flutter RVR I personally reviewed and interpreted the pt's EKG: see above for interpretation  I have reviewed the patients home medications and made adjustments as needed Consults: Cardiology and Hospitalist Social Determinants of health:  Geriatric  CRITICAL CARE Performed by: Lamar JAYSON Shan   Total critical care time: 30 minutes  Critical care time  was exclusive of separately billable procedures and treating other patients.  Critical care was necessary to treat or prevent imminent or life-threatening deterioration.  Critical care was time spent personally by me on the following activities: development of treatment plan with patient and/or surrogate as well as nursing, discussions with consultants, evaluation of patient's response to treatment, examination of patient, obtaining history from patient or surrogate, ordering and performing treatments and interventions, ordering and review of laboratory studies, ordering and review of radiographic studies, pulse oximetry and re-evaluation of patient's condition.   Portions of this note were generated with Scientist, clinical (histocompatibility and immunogenetics). Dictation errors may occur despite best attempts at proofreading.     Final diagnoses:  Atrial flutter with rapid ventricular response (HCC)  Chest pain, unspecified type    ED Discharge Orders          Ordered    apixaban (ELIQUIS) 5 MG TABS tablet  2 times daily        11/30/23 1303    Increase activity slowly        11/30/23 1303    Diet - low sodium heart healthy        11/30/23 1303    diltiazem (CARDIZEM) 30 MG tablet  Every 6 hours PRN        11/30/23 1303               Shan Lamar JAYSON, MD 12/01/23 0045    Shan Lamar JAYSON, MD 12/01/23 0045

## 2023-11-30 ENCOUNTER — Observation Stay (HOSPITAL_COMMUNITY)

## 2023-11-30 ENCOUNTER — Other Ambulatory Visit: Payer: Self-pay | Admitting: Student

## 2023-11-30 DIAGNOSIS — I10 Essential (primary) hypertension: Secondary | ICD-10-CM

## 2023-11-30 DIAGNOSIS — I4892 Unspecified atrial flutter: Secondary | ICD-10-CM

## 2023-11-30 DIAGNOSIS — R079 Chest pain, unspecified: Secondary | ICD-10-CM

## 2023-11-30 DIAGNOSIS — R7989 Other specified abnormal findings of blood chemistry: Secondary | ICD-10-CM

## 2023-11-30 LAB — RAPID URINE DRUG SCREEN, HOSP PERFORMED
Amphetamines: NOT DETECTED
Barbiturates: NOT DETECTED
Benzodiazepines: NOT DETECTED
Cocaine: POSITIVE — AB
Opiates: NOT DETECTED
Tetrahydrocannabinol: POSITIVE — AB

## 2023-11-30 LAB — HIV ANTIBODY (ROUTINE TESTING W REFLEX): HIV Screen 4th Generation wRfx: NONREACTIVE

## 2023-11-30 MED ORDER — APIXABAN 5 MG PO TABS: 5.0000 mg | ORAL_TABLET | Freq: Two times a day (BID) | ORAL | 1 refills | Status: AC

## 2023-11-30 MED ORDER — DILTIAZEM HCL 30 MG PO TABS: 30.0000 mg | ORAL_TABLET | Freq: Four times a day (QID) | ORAL | 11 refills | Status: AC | PRN

## 2023-11-30 MED ORDER — SODIUM CHLORIDE 0.9 % IV BOLUS
1000.0000 mL | Freq: Once | INTRAVENOUS | Status: AC
  Administered 2023-11-30: 1000 mL via INTRAVENOUS

## 2023-11-30 NOTE — Progress Notes (Signed)
 Ordered outpatient Echo. Please see Dr. Eston rounding note from today for more information.  Natia Fahmy E Morris Longenecker, PA-C 11/30/2023 9:04 AM

## 2023-11-30 NOTE — Discharge Summary (Signed)
 Physician Discharge Summary  Patient ID: Randy Melton MRN: [id number] DOB/AGE: [birth year] 68 y.o.  Admit date: 11/29/2023 Discharge date: 11/30/2023  Admission Diagnoses:  Discharge Diagnoses:  Principal Problem:   Atrial flutter with rapid ventricular response (HCC) Active Problems:   Elevated troponin   Chest pain   Essential hypertension   Discharged Condition: stable  Hospital Course: Patient is a 68 year old male with past medical history significant for hypertension, bradycardia/sinus arrhythmia, low testosterone , substance abuse and anxiety.  Workup revealed new onset atrial flutter.  Patient was admitted for further assessment and management.  Cardiology team was consulted to assist with patient's management.  Patient was initially started on Cardizem drip, however, patient became hypotensive.  Patient self converted to normal sinus rhythm.  Cardiology team has cleared patient for discharge.  Patient will be discharged on apixaban 5 mg p.o. twice daily.  Patient will also be discharged on Cardizem 30 mg p.o. every 6 hourly as needed for heart rate greater than 100 bpm.  Valsartan/HCTZ will be on hold on discharge, considering low blood pressure.  Patient may restart valsartan/HCTZ when blood pressure improves.  Cardiology team plans for echocardiography on outpatient basis.  Patient will follow cardiology team on discharge.    Chest pain: -Minimally elevated troponin. - As per cardiology team, this is secondary to atrial flutter with RVR - Chest pain has resolved.  Hypertension/hypotension: -Patient was on valsartan/HCTZ prior to admission. - Patient's blood pressure dropped and patient was started on Cardizem drip. - Cardizem drip was discontinued. - Valsartan/HCTZ is currently on hold until blood pressure improves (will defer to the primary care provider and cardiology team). - Patient will use Cardizem 30 mg p.o. Q6 hourly as needed for heart rate greater than 100  bpm.  Consults: cardiology  Significant Diagnostic Studies: - Troponin of 45-62. - D-dimer of 0.59.   - TSH of 1.476. - Chest x-ray did not reveal any active disease.   Discharge Exam: Blood pressure 109/77, pulse 67, temperature 98.5 F (36.9 C), resp. rate 18, SpO2 100%.   Disposition: Discharge disposition: 01-Home or Self Care.  Valsartan/HCTZ would be on hold due to hypotension.  Primary care provider and cardiology team to reassess patient and decide when to restart valsartan/HCTZ.  Cardiology team's recommendation: Apixaban 5 mg BID Diltiazem short acting 30 mg q6 hours PRN elevated heart rate >100 at rest   Restart home valsartan-hydrochlorothiazide tomorrow if blood pressure back to baseline   Other recommendations (labs, testing, etc):  we will arrange for outpatient echocardiogram. Consider outpatient coronary CT for risk stratification given demand ischemia in flutter with RVR   Follow up as an outpatient:  We will arrange for outpatient follow up at the [facility]       Discharge Instructions     Diet - low sodium heart healthy   Complete by: As directed    Increase activity slowly   Complete by: As directed       Allergies as of 11/30/2023   No Known Allergies      Medication List     STOP taking these medications    Alive Daily Energy Tabs   cholecalciferol 1000 units tablet Commonly known as: VITAMIN D   etodolac 400 MG tablet Commonly known as: LODINE   fish oil-omega-3 fatty acids 1000 MG capsule   GNP GINGKO BILOBA EXTRACT PO   OVER THE COUNTER MEDICATION   valsartan-hydrochlorothiazide 80-12.5 MG tablet Commonly known as: DIOVAN-HCT       TAKE these medications  apixaban 5 MG Tabs tablet Commonly known as: ELIQUIS Take 1 tablet (5 mg total) by mouth 2 (two) times daily.   diltiazem 30 MG tablet Commonly known as: Cardizem Take 1 tablet (30 mg total) by mouth every 6 (six) hours as needed (For heart rate greater than  100 bpm.).        Follow-up Information     Lelon Hamilton T, PA-C Follow up.   Specialties: Cardiology, Physician Assistant Why: Hospital follow-up with Cardiology scheduled for 12/12/2023 at 2:45pm. Please arrive 20 minutes early for check-in. If this date/ time does not work for you, please call our office to reschedule. Contact information: [address] [place] [address] [phone]         [facility] Follow up.   Specialty: Cardiology Why: Outpatient Echocardiogram scheduled for 12/19/2023 at 7:35am. If this date/ time does not work for you, please call our office to reschedule. Contact information: [address] [place]  [address] [phone]        Benjamine Aland, MD Follow up in 1 week(s).   Specialty: Family Medicine Contact information: [address] [place] [place] [address] [phone]                Time spent: 35 minutes.  SignedBETHA Leatrice LILLETTE Rosario 11/30/2023, 1:04 PM

## 2023-11-30 NOTE — ED Notes (Signed)
 Cardizem stopped due to patients low blood pressure and heart rate dropping below 60 bpm. MD Rathore made aware along with assigned Cardiologist.

## 2023-11-30 NOTE — Progress Notes (Signed)
   Rounding Note    Patient Name: Randy Melton Date of Encounter: 11/30/2023  [facility] Cardiologist: Maude Emmer, MD   Subjective   Presented yesterday, noted pounding/lateral chest pressure, found to be in new atrial flutter. Started on diltiazem drip but was hypotensive on this.   He notes he has had brief episodes of this in the past but nothing as prolonged or severe as yesterday.   We discussed atrial flutter, management, risk of stroke, signs/symptoms to watch for, see below.  Inpatient Medications    Scheduled Meds:  apixaban  5 mg Oral BID   Continuous Infusions:  diltiazem (CARDIZEM) infusion Stopped (11/30/23 0352)   PRN Meds:    Vital Signs    Vitals:   11/30/23 0559 11/30/23 0658 11/30/23 0713 11/30/23 0730  BP:  (!) 81/58 94/73 103/66  Pulse: 62 63 65 72  Resp: 15  16 18   Temp:      TempSrc:      SpO2: 99% 98% 100% 100%   No intake or output data in the 24 hours ending 11/30/23 0807    01/03/2022    1:52 PM 09/08/2018    9:12 PM 03/20/2018   11:54 AM  Last 3 Weights  Weight (lbs) 189 lb 12.8 oz 180 lb 192 lb 3.2 oz  Weight (kg) 86.093 kg 81.647 kg 87.181 kg      Telemetry    Atrial flutter, converted to sinus at 2:45 AM - Personally Reviewed  Physical Exam   GEN: No acute distress.   Neck: No JVD Cardiac: RRR, no murmurs, rubs, or gallops.  Respiratory: Clear to auscultation bilaterally. GI: Soft, nontender, non-distended  MS: No edema; No deformity. Neuro:  Nonfocal  Psych: Normal affect   New pertinent results (labs, ECG, imaging, cardiac studies)     Assessment & Plan    Atrial flutter -converted on diltiazem drip at 2:45 AM -will arrange for outpatient echo and cardiology follow up -he has slightly elevated troponins, likely due to RVR. He is now completely asymptomatic in normal rhythm and can exercise without limitations typically. Would consider outpatient cardiac CT for risk stratification. Previously had  normal ETT in 2023 -CHA2DS2/VAS Stroke Risk Points=2 -with intermittent symptoms and chadsvasc score, would continue apixaban going forward. Discussed risk of clot formation and stroke, he understands -will give oral PRN diltiazem for elevated heart rates -will arrange for outpatient follow up. He saw Dr. Emmer in 2023, so will get him follow up at [facility] office.  History of hypertension, but hypotensive on diltiazem drip -has been off drip, BP still low 100s systolic -taking valsartan-hydrochlorothiazide 80-12.5 mg daily at home. Would hold for today given hypotension, check home blood pressure and restart tomorrow if blood pressure back to baseline.  [facility] will sign off.   Medication Recommendations:   Apixaban 5 mg BID Diltiazem short acting 30 mg q6 hours PRN elevated heart rate >100 at rest  Restart home valsartan-hydrochlorothiazide tomorrow if blood pressure back to baseline  Other recommendations (labs, testing, etc):  we will arrange for outpatient echocardiogram. Consider outpatient coronary CT for risk stratification given demand ischemia in flutter with RVR  Follow up as an outpatient:  We will arrange for outpatient follow up at the [facility], Shelda Bruckner, MD  11/30/2023, 8:07 AM

## 2023-11-30 NOTE — H&P (Signed)
 History and Physical    Randy Melton FMW:[id number] DOB: 01-26-1955 DOA: 11/29/2023  PCP: Benjamine Aland, MD  Patient coming from: Home  Chief Complaint: Chest pain  HPI: Randy Melton is a 68 y.o. male with medical history significant of hypertension, bradycardia/sinus arrhythmia, low testosterone , substance abuse, anxiety presenting with complaints of chest pain and intermittent palpitations.  Patient states he was fixing his lawnmower 2 days ago when he noticed some pain on the left side of his chest which he thought was due to a pulled muscle.  He has continued to have chest pain since then and also having intermittent palpitations.  He went to his PCPs office and was found to be in A-fib/flutter with RVR and sent to the ED for further evaluation.  Patient does reporting generalized weakness.  Denies shortness of breath.  Denies leg pain or swelling or history of blood clots.  No other complaints  ED Course: Afebrile.  Heart rate in the 140s on arrival but not hypotensive.  Not hypoxemic.  EKG showing atrial flutter with variable block.  Labs notable for WBC count 3.2, hemoglobin 16.6, potassium 4.7, magnesium 2.0, TSH normal, troponin 45> 62, age-adjusted D-dimer within normal range.  Chest x-ray showing no active disease.  Patient was given Eliquis, aspirin, oral diltiazem, IV diltiazem bolus and subsequently started on continuous diltiazem infusion.  Cardiology consulted and [facility] called to admit.  Review of Systems:  Review of Systems  All other systems reviewed and are negative.   Past Medical History:  Diagnosis Date   Arthritis    Bradycardia    Hypertension    Low testosterone      Past Surgical History:  Procedure Laterality Date   COLON SURGERY     HERNIA REPAIR     KNEE SURGERY       reports that he quit smoking about 50 years ago. His smoking use included cigarettes. He has never used smokeless tobacco. He reports current alcohol use. He reports that he does not  currently use drugs after having used the following drugs: Cocaine.  No Known Allergies  Family History  Problem Relation Age of Onset   Cancer Cousin        colon     Prior to Admission medications   Medication Sig Start Date End Date Taking? Authorizing Provider  cholecalciferol (VITAMIN D) 1000 units tablet Take 1,000 Units by mouth daily.    [provider]  COVID-19 mRNA bivalent vaccine, Pfizer, injection Inject into the muscle. 10/12/20   Luiz Channel, MD  diazepam  (VALIUM ) 5 MG tablet Take 0.5 tablets (2.5 mg total) by mouth every 6 (six) hours as needed for anxiety (spasms). Patient not taking: Reported on 01/03/2022 09/09/18   Mesner, Selinda, MD  docusate sodium  (COLACE) 100 MG capsule Take 1 capsule (100 mg total) by mouth 2 (two) times daily. 05/02/22   McDonald, Juliene SAUNDERS, DPM  etodolac (LODINE) 400 MG tablet Take 400 mg by mouth 2 (two) times daily. 09/02/17   [provider]  fish oil-omega-3 fatty acids 1000 MG capsule Take 2 g by mouth daily.      [provider]  gabapentin  (NEURONTIN ) 300 MG capsule Take 1 capsule (300 mg total) by mouth 3 (three) times daily for 7 days. 04/28/22 05/05/22  Silva Juliene SAUNDERS, DPM  Multiple Vitamin (MULTIVITAMIN) tablet Take 1 tablet by mouth daily.      [provider]  naproxen (NAPROSYN) 500 MG tablet  06/07/20   [provider]  NON FORMULARY     [provider]  NON FORMULARY     [provider]  predniSONE  (DELTASONE ) 20 MG tablet 3 tabs po daily x 3 days, then 2 tabs x 3 days, then 1.5 tabs x 3 days, then 1 tab x 3 days, then 0.5 tabs x 3 days Patient not taking: Reported on 01/03/2022 09/09/18   Mesner, Selinda, MD  valsartan-hydrochlorothiazide (DIOVAN-HCT) 80-12.5 MG tablet TAKE 1 TABLET BY MOUTH EVERY DAY FOR BLOOD PRESSURE 09/06/17   [provider]    Physical Exam: Vitals:   11/29/23 2130 11/29/23 2158 11/29/23 2215 11/29/23 2230  BP: (!) 124/92  117/85 108/85   Pulse: 83 (!) 116 (!) 51 (!) 50  Resp: 19 18 (!) 22 16  Temp:      SpO2: 99% 100% 99% 99%    Physical Exam Vitals reviewed.  Constitutional:      General: He is not in acute distress. HENT:     Head: Normocephalic and atraumatic.  Eyes:     Extraocular Movements: Extraocular movements intact.  Cardiovascular:     Rate and Rhythm: Normal rate. Rhythm irregular.     Heart sounds: No murmur heard. Pulmonary:     Effort: Pulmonary effort is normal. No respiratory distress.     Breath sounds: Normal breath sounds.  Abdominal:     General: Bowel sounds are normal.     Palpations: Abdomen is soft.     Tenderness: There is no abdominal tenderness. There is no guarding.  Musculoskeletal:     Cervical back: Normal range of motion.     Right lower leg: No edema.     Left lower leg: No edema.  Skin:    General: Skin is warm and dry.  Neurological:     General: No focal deficit present.     Mental Status: He is alert and oriented to person, place, and time.     Labs on Admission: I have personally reviewed following labs and imaging studies  CBC: Recent Labs  Lab 11/29/23 1933  WBC 3.2*  HGB 16.6  HCT 49.5  MCV 90.0  PLT 271   Basic Metabolic Panel: Recent Labs  Lab 11/29/23 1933  NA 137  K 4.7  CL 105  CO2 22  GLUCOSE 93  BUN 15  CREATININE 0.82  CALCIUM 9.6  MG 2.0   GFR: CrCl cannot be calculated (Unknown ideal weight.). Liver Function Tests: No results for input(s): AST, ALT, ALKPHOS, BILITOT, PROT, ALBUMIN in the last 168 hours. No results for input(s): LIPASE, AMYLASE in the last 168 hours. No results for input(s): AMMONIA in the last 168 hours. Coagulation Profile: No results for input(s): INR, PROTIME in the last 168 hours. Cardiac Enzymes: No results for input(s): CKTOTAL, CKMB, CKMBINDEX, TROPONINI in the last 168 hours. BNP (last 3 results) No results for input(s): PROBNP in the last 8760 hours. HbA1C: No  results for input(s): HGBA1C in the last 72 hours. CBG: No results for input(s): GLUCAP in the last 168 hours. Lipid Profile: No results for input(s): CHOL, HDL, LDLCALC, TRIG, CHOLHDL, LDLDIRECT in the last 72 hours. Thyroid Function Tests: Recent Labs    11/29/23 1933  TSH 1.476   Anemia Panel: No results for input(s): VITAMINB12, FOLATE, FERRITIN, TIBC, IRON, RETICCTPCT in the last 72 hours. Urine analysis:    Component Value Date/Time   COLORURINE AMBER (A) 09/08/2018 2211   APPEARANCEUR HAZY (A) 09/08/2018 2211   LABSPEC 1.024 09/08/2018 2211   PHURINE 6.0  09/08/2018 2211   GLUCOSEU NEGATIVE 09/08/2018 2211   HGBUR NEGATIVE 09/08/2018 2211   BILIRUBINUR SMALL (A) 09/08/2018 2211   KETONESUR NEGATIVE 09/08/2018 2211   PROTEINUR NEGATIVE 09/08/2018 2211   UROBILINOGEN 0.2 05/17/2009 1727   NITRITE NEGATIVE 09/08/2018 2211   LEUKOCYTESUR NEGATIVE 09/08/2018 2211    Radiological Exams on Admission: DG Chest Port 1 View Result Date: 11/29/2023 CLINICAL DATA:  Chest pain. EXAM: PORTABLE CHEST 1 VIEW COMPARISON:  Chest radiograph dated 05/17/2009. FINDINGS: The heart size and mediastinal contours are within normal limits. Both lungs are clear. The visualized skeletal structures are unremarkable. IMPRESSION: No active disease. Electronically Signed   By: Vanetta Chou M.D.   On: 11/29/2023 19:22    Assessment and Plan  New onset symptomatic atrial flutter with variable block and rapid ventricular rate PE less likely given no hypoxemia, no clinical signs of DVT, and age-adjusted D-dimer within normal range.  TSH and electrolytes normal.  Heart rate initially in the 140s.  Patient was seen by cardiology and started on diltiazem drip for rate control and Eliquis for anticoagulation.  TTE ordered.  Cardiology recommended keeping n.p.o. after midnight for TEE cardioversion in the morning.  Diltiazem drip currently on hold due to mild bradycardia and soft  blood pressure.  1 L IV fluid bolus ordered.  Check UDS given history of cocaine abuse in the past.  Chest pain and elevated troponin Cardiology feels this is due to atrial flutter with RVR rather than ACS and recommending further ischemic evaluation as an outpatient.  Hypertension Hold home antihypertensives at this time given soft blood pressure.  DVT prophylaxis: Eliquis Code Status: Full Code (discussed with the patient) Level of care: Progressive Care Unit Admission status: It is my clinical opinion that referral for OBSERVATION is reasonable and necessary in this patient based on the above information provided. The aforementioned taken together are felt to place the patient at high risk for further clinical deterioration. However, it is anticipated that the patient may be medically stable for discharge from the hospital within 24 to 48 hours.  Editha Ram MD [facility]  If 7PM-7AM, please contact night-coverage [email or web address]  11/30/2023, 12:07 AM

## 2023-12-11 NOTE — Progress Notes (Unsigned)
 OFFICE NOTE:    Date:  12/12/2023  ID:  Randy Melton, DOB 1956/01/14, MRN [id number] PCP: Benjamine Aland, MD  [facility] Providers Cardiologist:  Maude Emmer, MD        Atrial flutter Hypertension  Low Testosterone  ETT 12/2021: normal   Cocaine use THC use          HPI Randy Melton is a 68 y.o. male for post hospital follow up. He was seen by Dr. Emmer 2 years ago.  Pt seen in ED 11/29/23 with AFlutter w RVR.  He presented with palpitations, chest pain and shortness of breath.  He was noted to be in atrial flutter with rapid rate at his primary care doctor's office and sent to the emergency room.  He was placed on IV diltiazem and Eliquis.  He converted to normal sinus rhythm and diltiazem was stopped secondary to hypotension.  He had mildly elevated troponins thought to be related to rapid ventricular rate.  He was seen by Dr. Lonni who recommended considering outpatient cardiac CT for restratification.  He was continued on apixaban for anticoagulation and given diltiazem to use as needed for elevated heart rates.  Echocardiogram is scheduled for next week.  He is here alone. He is doing well w/o chest pain, shortness of breath, syncope, orthopnea, edema. He has quit smoking, drinking and using drugs. He notes he saw his PCP and had an EKG x 2 since his hospital stay. His PCP told him to start taking Diltiazem 30 mg twice daily.    ROS-See HPI     Studies Reviewed:  EKG Interpretation Date/Time:  Wednesday December 12 2023 15:24:34 EST Ventricular Rate:  67 PR Interval:  134 QRS Duration:  96 QT Interval:  370 QTC Calculation: 390 R Axis:   5  Text Interpretation: Sinus rhythm with Premature atrial complexes Nonspecific T wave abnormality Confirmed by Lelon Hamilton [phone]) on 12/12/2023 3:25:45 PM    LABS 11/29/23: K 4.7, SCr 0.82, eGFR > 60, WBC 3.2, HGb 16.6, PLT 271K, DDimer 0.59, TSH 1.476 11/29/23: hsTrop I 45>>62 11/30/23: UDS +Cocaine,  +THC  RADIOLOGY CXR 11/29/23: NAD   Risk Assessment/Calculations: CHA2DS2-VASc Score = 2   This indicates a 2.2% annual risk of stroke. The patient's score is based upon: CHF History: 0 HTN History: 1 Diabetes History: 0 Stroke History: 0 Vascular Disease History: 0 Age Score: 1 Gender Score: 0           Physical Exam:  VS:  BP 112/62   Pulse 79   Ht 5' 9 (1.753 m)   Wt 178 lb 12.8 oz (81.1 kg)   SpO2 98%   BMI 26.40 kg/m        Wt Readings from Last 3 Encounters:  12/12/23 178 lb 12.8 oz (81.1 kg)  01/03/22 189 lb 12.8 oz (86.1 kg)  09/08/18 180 lb (81.6 kg)    Constitutional:      Appearance: Healthy appearance. Not in distress.  Neck:     Vascular: JVD normal.  Pulmonary:     Breath sounds: Normal breath sounds. No wheezing. No rales.  Cardiovascular:     Normal rate. Regular rhythm.     Murmurs: There is no murmur.  Edema:    Peripheral edema absent.  Abdominal:     Palpations: Abdomen is soft.        Assessment and Plan:    Assessment & Plan Typical atrial flutter (HCC) EKG today demonstrates NSR. CHADS2-VASc=2. He should remain  on long term anticoagulation. I will review his EKG with EP. But it looks like typical flutter and he may be a candidate for ablation. If so, will refer to EP. He would like to continue follow up with Dr. Delford here.  - Continue Eliquis 5 mg twice daily  - I reviewed w EP. Will refer to EP for possible CTI ablation.  - Follow up 4 mos Essential hypertension BP controlled. He was previously on Valsartan. He is only on Diltiazem 30 mg twice daily now. Continue current Rx.  Elevated troponin Precordial pain He presented with chest pain when he was in AFlutter w RVR. His elevated Troponins were likely related to demand in rapid ventricular rate. CCTA was suggested in the hospital. I discussed this with him today. It is reasonable as he has some RFs for CAD. With shared decision making, we decided to go ahead and pursue CCTA to  r/o obstructive CAD. - Arrange CCTA  - Metoprolol 50 mg x 1 prior to CT. I advised him to notify us  if he uses cocaine again. I would not want him to take Metoprolol if he does. We discussed the potential for unopposed alpha stimulation.  Cocaine abuse in remission [facility]) He notes he has quit smoking, drinking alcohol and using cocaine and does not plan to resume.         Dispo:  Return in about 4 months (around 04/10/2024) for Routine Follow Up, w/ Dr. Delford, or Glendia Ferrier, PA-C.  Signed, Glendia Ferrier, PA-C

## 2023-12-12 ENCOUNTER — Encounter: Payer: Self-pay | Admitting: Physician Assistant

## 2023-12-12 ENCOUNTER — Ambulatory Visit: Attending: Physician Assistant | Admitting: Physician Assistant

## 2023-12-12 VITALS — BP 112/62 | HR 79 | Ht 69.0 in | Wt 178.8 lb

## 2023-12-12 DIAGNOSIS — R7989 Other specified abnormal findings of blood chemistry: Secondary | ICD-10-CM | POA: Diagnosis not present

## 2023-12-12 DIAGNOSIS — I10 Essential (primary) hypertension: Secondary | ICD-10-CM

## 2023-12-12 DIAGNOSIS — F1411 Cocaine abuse, in remission: Secondary | ICD-10-CM

## 2023-12-12 DIAGNOSIS — I483 Typical atrial flutter: Secondary | ICD-10-CM

## 2023-12-12 DIAGNOSIS — R072 Precordial pain: Secondary | ICD-10-CM | POA: Diagnosis not present

## 2023-12-12 LAB — BASIC METABOLIC PANEL WITH GFR
BUN/Creatinine Ratio: 16 (ref 10–24)
BUN: 15 mg/dL (ref 8–27)
CO2: 21 mmol/L (ref 20–29)
Calcium: 9.6 mg/dL (ref 8.6–10.2)
Chloride: 103 mmol/L (ref 96–106)
Creatinine, Ser: 0.96 mg/dL (ref 0.76–1.27)
Glucose: 99 mg/dL (ref 70–99)
Potassium: 4.2 mmol/L (ref 3.5–5.2)
Sodium: 138 mmol/L (ref 134–144)
eGFR: 86 mL/min/1.73 (ref >59)

## 2023-12-12 MED ORDER — METOPROLOL TARTRATE 50 MG PO TABS: 50.0000 mg | ORAL_TABLET | Freq: Once | ORAL | 0 refills | Status: AC

## 2023-12-12 NOTE — Assessment & Plan Note (Addendum)
 He presented with chest pain when he was in AFlutter w RVR. His elevated Troponins were likely related to demand in rapid ventricular rate. CCTA was suggested in the hospital. I discussed this with him today. It is reasonable as he has some RFs for CAD. With shared decision making, we decided to go ahead and pursue CCTA to r/o obstructive CAD. - Arrange CCTA  - Metoprolol 50 mg x 1 prior to CT. I advised him to notify us  if he uses cocaine again. I would not want him to take Metoprolol if he does. We discussed the potential for unopposed alpha stimulation.

## 2023-12-12 NOTE — Assessment & Plan Note (Signed)
 BP controlled. He was previously on Valsartan. He is only on Diltiazem 30 mg twice daily now. Continue current Rx.

## 2023-12-12 NOTE — Patient Instructions (Addendum)
 Thank you for choosing [facility]!     Medication Instructions:  On the day of the Cardiac CTA, take the Metoprolol 50mg  2 hours before the procedure.  *If you need a refill on your cardiac medications before your next appointment, please call your pharmacy*   Lab Work: BMET If you have labs (blood work) drawn today and your tests are completely normal, you will receive your results only by: MyChart Message (if you have MyChart) OR A paper copy in the mail If you have any lab test that is abnormal or we need to change your treatment, we will call you to review the results.   Testing/Procedures:   Your cardiac CT will be scheduled at one of the below locations:   [facility] [address] [place], [place] [address] [phone] (Severe contrast allergies only)  OR   [facility] [address] [place], [place] [address] [phone]  OR   [facility] [address] [place], [place] [address] [phone]  OR   [facility] [address]  [place], [place] [address]  OR   [facility] [address] [place], [place] [phone]  If scheduled at [facility], please arrive at the [facility] Entrance (Entrance C2) of [facility] 30 minutes prior to test start time. You can use the FREE valet parking offered at entrance C (encouraged to control the heart rate for the test)  Proceed to the [facility] Radiology Department (first floor) to check-in and test prep.  All radiology patients and guests should use entrance C2 at [facility], accessed from [facility], even though the hospital's physical address listed is [address]  If scheduled at the Heart and Vascular Tower at [address], please enter the parking lot using the [address] entrance and use the FREE valet service at the patient drop-off  area. Enter the building and check-in with registration on the main floor.  If scheduled at [facility], please arrive to the Heart and Vascular Center 15 mins early for check-in and test prep.  There is spacious parking and easy access to the radiology department from the [facility] entrance. Please enter here and check-in with the desk attendant.   If scheduled at [facility], please arrive 30 minutes early for check-in and test prep.  Please follow these instructions carefully (unless otherwise directed):  An IV will be required for this test and Nitroglycerin will be given.  Hold all erectile dysfunction medications at least 3 days (72 hrs) prior to test. (Ie viagra, cialis, sildenafil, tadalafil, etc)   On the Night Before the Test: Be sure to Drink plenty of water. Do not consume any caffeinated/decaffeinated beverages or chocolate 12 hours prior to your test. Do not take any antihistamines 12 hours prior to your test.  If the patient has contrast allergy: Patient will need a prescription for Prednisone  and very clear instructions (as follows): Prednisone  50 mg - take 13 hours prior to test Take another Prednisone  50 mg 7 hours prior to test Take another Prednisone  50 mg 1 hour prior to test Take Benadryl 50 mg 1 hour prior to test Patient must complete all four doses of above prophylactic medications. Patient will need a ride after test due to Benadryl.  On the Day of the Test: Drink plenty of water until 1 hour prior to the  test. Do not eat any food 1 hour prior to test. You may take your regular medications prior to the test.  Take metoprolol (Lopressor) two hours prior to test. If you take Furosemide/Hydrochlorothiazide/Spironolactone/Chlorthalidone, please HOLD on the morning of the test. Patients who wear a continuous glucose monitor MUST remove the device prior to scanning.        After the Test: Drink plenty of  water. After receiving IV contrast, you may experience a mild flushed feeling. This is normal. On occasion, you may experience a mild rash up to 24 hours after the test. This is not dangerous. If this occurs, you can take Benadryl 25 mg, Zyrtec, Claritin, or Allegra and increase your fluid intake. (Patients taking Tikosyn should avoid Benadryl, and may take Zyrtec, Claritin, or Allegra) If you experience trouble breathing, this can be serious. If it is severe call 911 IMMEDIATELY. If it is mild, please call our office.  We will call to schedule your test 2-4 weeks out understanding that some insurance companies will need an authorization prior to the service being performed.   For more information and frequently asked questions, please visit our website : [email or web address]  For non-scheduling related questions, please contact the cardiac imaging nurse navigator should you have any questions/concerns: Cardiac Imaging Nurse Navigators Direct Office Dial: [phone]   For scheduling needs, including cancellations and rescheduling, please call Brittany, [phone].   Your next appointment:   4 month(s)   Provider:   Maude Emmer, MD     Follow-Up: At [facility], you and your health needs are our priority.  As part of our continuing mission to provide you with exceptional heart care, we have created designated Provider Care Teams.  These Care Teams include your primary Cardiologist (physician) and Advanced Practice Providers (APPs -  Physician Assistants and Nurse Practitioners) who all work together to provide you with the care you need, when you need it. We recommend signing up for the patient portal called MyChart.  Sign up information is provided on this After Visit Summary.  MyChart is used to connect with patients for Virtual Visits (Telemedicine).  Patients are able to view lab/test results, encounter notes, upcoming appointments, etc.  Non-urgent messages  can be sent to your provider as well.   To learn more about what you can do with MyChart, go to [email or web address].

## 2023-12-13 ENCOUNTER — Ambulatory Visit: Payer: Self-pay | Admitting: Physician Assistant

## 2023-12-13 NOTE — Telephone Encounter (Signed)
 Patient returned staff call regarding results.

## 2023-12-19 ENCOUNTER — Ambulatory Visit (HOSPITAL_COMMUNITY)
Admission: RE | Admit: 2023-12-19 | Discharge: 2023-12-19 | Disposition: A | Discharge status: Home / Self Care | Source: Ambulatory Visit | Attending: Cardiology | Admitting: Cardiology

## 2023-12-19 DIAGNOSIS — I10 Essential (primary) hypertension: Secondary | ICD-10-CM | POA: Diagnosis not present

## 2023-12-19 DIAGNOSIS — I4892 Unspecified atrial flutter: Secondary | ICD-10-CM | POA: Insufficient documentation

## 2023-12-19 DIAGNOSIS — R079 Chest pain, unspecified: Secondary | ICD-10-CM | POA: Diagnosis not present

## 2023-12-19 LAB — ECHOCARDIOGRAM COMPLETE
Area-P 1/2: 3.45 cm2
S' Lateral: 3.33 cm

## 2023-12-20 ENCOUNTER — Ambulatory Visit: Payer: Self-pay | Admitting: Student

## 2024-01-01 NOTE — Progress Notes (Signed)
 Spoke with patient regarding echo results. Patient verbalized understanding. Patient knows that he is scheduled to come in on next week 12/18 for cta. Confirmed patient has the metoprolol  tablet and to take 2 hours before the procedure.

## 2024-01-10 ENCOUNTER — Ambulatory Visit (HOSPITAL_COMMUNITY)

## 2024-01-10 DIAGNOSIS — R072 Precordial pain: Secondary | ICD-10-CM | POA: Insufficient documentation

## 2024-01-10 DIAGNOSIS — R7989 Other specified abnormal findings of blood chemistry: Secondary | ICD-10-CM | POA: Diagnosis present

## 2024-01-10 DIAGNOSIS — I483 Typical atrial flutter: Secondary | ICD-10-CM | POA: Diagnosis present

## 2024-01-10 DIAGNOSIS — I10 Essential (primary) hypertension: Secondary | ICD-10-CM | POA: Diagnosis present

## 2024-01-10 MED ORDER — IOHEXOL 350 MG/ML SOLN
100.0000 mL | Freq: Once | INTRAVENOUS | Status: AC | PRN
  Administered 2024-01-10: 08:00:00 100 mL via INTRAVENOUS

## 2024-01-10 MED ORDER — NITROGLYCERIN 0.4 MG SL SUBL
0.8000 mg | SUBLINGUAL_TABLET | Freq: Once | SUBLINGUAL | Status: AC
  Administered 2024-01-10: 08:00:00 0.8 mg via SUBLINGUAL

## 2024-01-11 ENCOUNTER — Encounter: Payer: Self-pay | Admitting: Physician Assistant

## 2024-01-11 NOTE — Telephone Encounter (Addendum)
 Called patient in regards to results, patient verbalized understanding of results  ----- Message from Glendia Ferrier, PA-C sent at 01/11/2024 11:51 AM EST ----- Copy sent to PCP as FYI.  Please call the patient. CT shows a calcium score of 0 and no CAD. This is great news! PLAN:  -  Continue current medications/treatment plan and follow up as scheduled.  Glendia Ferrier, PA-C    01/11/2024 11:48 AM

## 2024-03-18 ENCOUNTER — Ambulatory Visit: Admitting: Cardiovascular Disease

## 2024-04-04 ENCOUNTER — Ambulatory Visit: Admitting: Cardiovascular Disease
